# Patient Record
Sex: Female | Born: 1955
Health system: Southern US, Community
[De-identification: ages and names within clinical notes are randomized; demographics above are authoritative.]

## PROBLEM LIST (undated history)

## (undated) DIAGNOSIS — H269 Unspecified cataract: Secondary | ICD-10-CM

## (undated) DIAGNOSIS — I10 Essential (primary) hypertension: Secondary | ICD-10-CM

## (undated) DIAGNOSIS — Z9221 Personal history of antineoplastic chemotherapy: Secondary | ICD-10-CM

## (undated) HISTORY — PX: BREAST SURGERY: SHX581

## (undated) HISTORY — DX: Essential (primary) hypertension: I10

## (undated) HISTORY — DX: Unspecified cataract: H26.9

## (undated) HISTORY — PX: MASTECTOMY: SHX3

---

## 1998-06-03 ENCOUNTER — Encounter: Admission: RE | Admit: 1998-06-03 | Discharge: 1998-06-03 | Payer: Self-pay | Admitting: *Deleted

## 1998-06-30 ENCOUNTER — Encounter: Payer: Self-pay | Admitting: Internal Medicine

## 1998-06-30 ENCOUNTER — Ambulatory Visit (HOSPITAL_COMMUNITY): Admission: RE | Admit: 1998-06-30 | Discharge: 1998-06-30 | Payer: Self-pay | Admitting: Internal Medicine

## 1998-10-12 ENCOUNTER — Other Ambulatory Visit: Admission: RE | Admit: 1998-10-12 | Discharge: 1998-10-12 | Payer: Self-pay | Admitting: Obstetrics and Gynecology

## 1999-10-22 ENCOUNTER — Other Ambulatory Visit: Admission: RE | Admit: 1999-10-22 | Discharge: 1999-10-22 | Payer: Self-pay | Admitting: Obstetrics and Gynecology

## 2000-11-15 ENCOUNTER — Other Ambulatory Visit: Admission: RE | Admit: 2000-11-15 | Discharge: 2000-11-15 | Payer: Self-pay | Admitting: Obstetrics and Gynecology

## 2000-11-21 ENCOUNTER — Encounter: Payer: Self-pay | Admitting: Obstetrics and Gynecology

## 2000-11-21 ENCOUNTER — Encounter: Admission: RE | Admit: 2000-11-21 | Discharge: 2000-11-21 | Payer: Self-pay | Admitting: Obstetrics and Gynecology

## 2000-11-22 ENCOUNTER — Encounter: Payer: Self-pay | Admitting: Obstetrics and Gynecology

## 2000-11-22 ENCOUNTER — Encounter (INDEPENDENT_AMBULATORY_CARE_PROVIDER_SITE_OTHER): Payer: Self-pay | Admitting: Specialist

## 2000-11-22 ENCOUNTER — Other Ambulatory Visit: Admission: RE | Admit: 2000-11-22 | Discharge: 2000-11-22 | Payer: Self-pay | Admitting: Obstetrics and Gynecology

## 2000-11-22 ENCOUNTER — Encounter: Admission: RE | Admit: 2000-11-22 | Discharge: 2000-11-22 | Payer: Self-pay | Admitting: Obstetrics and Gynecology

## 2000-12-04 ENCOUNTER — Encounter: Payer: Self-pay | Admitting: Surgery

## 2000-12-04 ENCOUNTER — Ambulatory Visit (HOSPITAL_BASED_OUTPATIENT_CLINIC_OR_DEPARTMENT_OTHER): Admission: RE | Admit: 2000-12-04 | Discharge: 2000-12-04 | Payer: Self-pay | Admitting: Surgery

## 2000-12-04 ENCOUNTER — Encounter (INDEPENDENT_AMBULATORY_CARE_PROVIDER_SITE_OTHER): Payer: Self-pay | Admitting: *Deleted

## 2000-12-04 ENCOUNTER — Encounter: Admission: RE | Admit: 2000-12-04 | Discharge: 2000-12-04 | Payer: Self-pay | Admitting: Surgery

## 2001-01-10 ENCOUNTER — Encounter: Payer: Self-pay | Admitting: Surgery

## 2001-01-12 ENCOUNTER — Encounter (INDEPENDENT_AMBULATORY_CARE_PROVIDER_SITE_OTHER): Payer: Self-pay | Admitting: *Deleted

## 2001-01-12 ENCOUNTER — Inpatient Hospital Stay (HOSPITAL_COMMUNITY): Admission: RE | Admit: 2001-01-12 | Discharge: 2001-01-16 | Payer: Self-pay | Admitting: Surgery

## 2001-02-13 ENCOUNTER — Encounter: Admission: RE | Admit: 2001-02-13 | Discharge: 2001-05-14 | Payer: Self-pay | Admitting: Plastic Surgery

## 2001-02-22 ENCOUNTER — Encounter: Payer: Self-pay | Admitting: Surgery

## 2001-02-22 ENCOUNTER — Ambulatory Visit (HOSPITAL_COMMUNITY): Admission: RE | Admit: 2001-02-22 | Discharge: 2001-02-22 | Payer: Self-pay | Admitting: Surgery

## 2001-10-04 ENCOUNTER — Other Ambulatory Visit: Admission: RE | Admit: 2001-10-04 | Discharge: 2001-10-04 | Payer: Self-pay | Admitting: *Deleted

## 2001-10-09 ENCOUNTER — Ambulatory Visit (HOSPITAL_BASED_OUTPATIENT_CLINIC_OR_DEPARTMENT_OTHER): Admission: RE | Admit: 2001-10-09 | Discharge: 2001-10-09 | Payer: Self-pay | Admitting: Surgery

## 2001-11-27 ENCOUNTER — Encounter: Admission: RE | Admit: 2001-11-27 | Discharge: 2001-11-27 | Payer: Self-pay | Admitting: Oncology

## 2001-11-27 ENCOUNTER — Encounter: Payer: Self-pay | Admitting: Oncology

## 2002-05-31 ENCOUNTER — Encounter: Payer: Self-pay | Admitting: Emergency Medicine

## 2002-05-31 ENCOUNTER — Emergency Department (HOSPITAL_COMMUNITY): Admission: EM | Admit: 2002-05-31 | Discharge: 2002-05-31 | Payer: Self-pay | Admitting: Emergency Medicine

## 2002-11-28 ENCOUNTER — Encounter: Payer: Self-pay | Admitting: Oncology

## 2002-11-28 ENCOUNTER — Encounter: Admission: RE | Admit: 2002-11-28 | Discharge: 2002-11-28 | Payer: Self-pay | Admitting: Oncology

## 2003-02-03 ENCOUNTER — Other Ambulatory Visit: Admission: RE | Admit: 2003-02-03 | Discharge: 2003-02-03 | Payer: Self-pay

## 2004-01-30 ENCOUNTER — Other Ambulatory Visit: Admission: RE | Admit: 2004-01-30 | Discharge: 2004-01-30 | Payer: Self-pay | Admitting: Family Medicine

## 2004-04-21 ENCOUNTER — Ambulatory Visit (HOSPITAL_COMMUNITY): Admission: RE | Admit: 2004-04-21 | Discharge: 2004-04-21 | Payer: Self-pay | Admitting: Gastroenterology

## 2004-04-21 ENCOUNTER — Encounter (INDEPENDENT_AMBULATORY_CARE_PROVIDER_SITE_OTHER): Payer: Self-pay | Admitting: *Deleted

## 2004-06-23 ENCOUNTER — Encounter: Admission: RE | Admit: 2004-06-23 | Discharge: 2004-06-23 | Payer: Self-pay | Admitting: Oncology

## 2004-08-06 ENCOUNTER — Ambulatory Visit: Payer: Self-pay | Admitting: Oncology

## 2004-08-06 ENCOUNTER — Ambulatory Visit (HOSPITAL_COMMUNITY): Admission: RE | Admit: 2004-08-06 | Discharge: 2004-08-06 | Payer: Self-pay | Admitting: Oncology

## 2004-09-03 ENCOUNTER — Encounter (HOSPITAL_COMMUNITY): Admission: RE | Admit: 2004-09-03 | Discharge: 2004-10-02 | Payer: Self-pay | Admitting: Oncology

## 2004-09-04 ENCOUNTER — Encounter (INDEPENDENT_AMBULATORY_CARE_PROVIDER_SITE_OTHER): Payer: Self-pay | Admitting: Specialist

## 2004-09-04 ENCOUNTER — Ambulatory Visit (HOSPITAL_COMMUNITY): Admission: RE | Admit: 2004-09-04 | Discharge: 2004-09-04 | Payer: Self-pay | Admitting: Obstetrics and Gynecology

## 2004-09-22 ENCOUNTER — Ambulatory Visit: Payer: Self-pay | Admitting: Oncology

## 2004-12-08 ENCOUNTER — Ambulatory Visit: Payer: Self-pay | Admitting: Oncology

## 2005-03-02 ENCOUNTER — Ambulatory Visit: Payer: Self-pay | Admitting: Oncology

## 2005-06-24 ENCOUNTER — Encounter: Admission: RE | Admit: 2005-06-24 | Discharge: 2005-06-24 | Payer: Self-pay | Admitting: Oncology

## 2005-08-31 ENCOUNTER — Ambulatory Visit: Payer: Self-pay | Admitting: Oncology

## 2005-12-06 ENCOUNTER — Ambulatory Visit: Payer: Self-pay | Admitting: Oncology

## 2006-03-03 ENCOUNTER — Ambulatory Visit: Payer: Self-pay | Admitting: Oncology

## 2006-03-03 LAB — COMPREHENSIVE METABOLIC PANEL
Albumin: 4.4 g/dL (ref 3.5–5.2)
Alkaline Phosphatase: 52 U/L (ref 39–117)
BUN: 15 mg/dL (ref 6–23)
CO2: 29 mEq/L (ref 19–32)
Calcium: 9.8 mg/dL (ref 8.4–10.5)
Glucose, Bld: 82 mg/dL (ref 70–99)
Potassium: 4.1 mEq/L (ref 3.5–5.3)
Total Protein: 7.2 g/dL (ref 6.0–8.3)

## 2006-03-03 LAB — CBC WITH DIFFERENTIAL/PLATELET
Basophils Absolute: 0.1 10*3/uL (ref 0.0–0.1)
Eosinophils Absolute: 0.1 10*3/uL (ref 0.0–0.5)
HGB: 12.5 g/dL (ref 11.6–15.9)
MCV: 95.8 fL (ref 81.0–101.0)
MONO#: 0.5 10*3/uL (ref 0.1–0.9)
MONO%: 8.6 % (ref 0.0–13.0)
NEUT#: 3.2 10*3/uL (ref 1.5–6.5)
Platelets: 233 10*3/uL (ref 145–400)
RDW: 14.1 % (ref 11.3–14.5)
WBC: 5.9 10*3/uL (ref 3.9–10.0)

## 2006-03-03 LAB — CANCER ANTIGEN 27.29: CA 27.29: 14 U/mL (ref 0–39)

## 2006-07-13 ENCOUNTER — Encounter: Admission: RE | Admit: 2006-07-13 | Discharge: 2006-07-13 | Payer: Self-pay | Admitting: Oncology

## 2006-09-05 ENCOUNTER — Ambulatory Visit: Payer: Self-pay | Admitting: Oncology

## 2006-09-08 LAB — CBC WITH DIFFERENTIAL/PLATELET
Basophils Absolute: 0.1 10*3/uL (ref 0.0–0.1)
Eosinophils Absolute: 0.2 10*3/uL (ref 0.0–0.5)
HCT: 38.9 % (ref 34.8–46.6)
HGB: 13.3 g/dL (ref 11.6–15.9)
LYMPH%: 36.9 % (ref 14.0–48.0)
MCV: 95.8 fL (ref 81.0–101.0)
MONO%: 7.3 % (ref 0.0–13.0)
NEUT#: 2.9 10*3/uL (ref 1.5–6.5)
Platelets: 260 10*3/uL (ref 145–400)

## 2006-09-08 LAB — COMPREHENSIVE METABOLIC PANEL
Albumin: 4.6 g/dL (ref 3.5–5.2)
Alkaline Phosphatase: 69 U/L (ref 39–117)
BUN: 16 mg/dL (ref 6–23)
Glucose, Bld: 95 mg/dL (ref 70–99)
Total Bilirubin: 0.3 mg/dL (ref 0.3–1.2)

## 2007-03-07 ENCOUNTER — Ambulatory Visit: Payer: Self-pay | Admitting: Oncology

## 2007-03-08 LAB — CBC WITH DIFFERENTIAL/PLATELET
Basophils Absolute: 0 10*3/uL (ref 0.0–0.1)
HCT: 35 % (ref 34.8–46.6)
HGB: 12.1 g/dL (ref 11.6–15.9)
MONO#: 0.5 10*3/uL (ref 0.1–0.9)
NEUT%: 47.3 % (ref 39.6–76.8)
Platelets: 264 10*3/uL (ref 145–400)
WBC: 6.3 10*3/uL (ref 3.9–10.0)
lymph#: 2.6 10*3/uL (ref 0.9–3.3)

## 2007-03-08 LAB — FSH/LH
FSH: 35.1 m[IU]/mL
LH: 21.3 m[IU]/mL

## 2007-03-08 LAB — COMPREHENSIVE METABOLIC PANEL
BUN: 13 mg/dL (ref 6–23)
CO2: 25 mEq/L (ref 19–32)
Calcium: 9.5 mg/dL (ref 8.4–10.5)
Chloride: 105 mEq/L (ref 96–112)
Creatinine, Ser: 0.79 mg/dL (ref 0.40–1.20)
Glucose, Bld: 89 mg/dL (ref 70–99)

## 2007-03-16 LAB — ESTRADIOL, ULTRA SENS: Estradiol, Ultra Sensitive: <2 pg/mL

## 2007-07-26 ENCOUNTER — Encounter: Admission: RE | Admit: 2007-07-26 | Discharge: 2007-07-26 | Payer: Self-pay | Admitting: Oncology

## 2007-09-05 ENCOUNTER — Ambulatory Visit: Payer: Self-pay | Admitting: Oncology

## 2007-09-07 LAB — CBC WITH DIFFERENTIAL/PLATELET
EOS%: 1.7 % (ref 0.0–7.0)
Eosinophils Absolute: 0.1 10*3/uL (ref 0.0–0.5)
LYMPH%: 35.9 % (ref 14.0–48.0)
MCH: 33 pg (ref 26.0–34.0)
MCHC: 35.2 g/dL (ref 32.0–36.0)
MCV: 93.9 fL (ref 81.0–101.0)
MONO%: 4.8 % (ref 0.0–13.0)
NEUT#: 3.7 10*3/uL (ref 1.5–6.5)
Platelets: 262 10*3/uL (ref 145–400)
RBC: 4.01 10*6/uL (ref 3.70–5.32)
RDW: 14.1 % (ref 11.3–14.5)

## 2007-09-07 LAB — COMPREHENSIVE METABOLIC PANEL
AST: 17 U/L (ref 0–37)
Albumin: 4.6 g/dL (ref 3.5–5.2)
Alkaline Phosphatase: 72 U/L (ref 39–117)
Glucose, Bld: 89 mg/dL (ref 70–99)
Potassium: 4 mEq/L (ref 3.5–5.3)
Sodium: 139 mEq/L (ref 135–145)
Total Bilirubin: 0.3 mg/dL (ref 0.3–1.2)
Total Protein: 7.8 g/dL (ref 6.0–8.3)

## 2008-03-07 ENCOUNTER — Ambulatory Visit: Payer: Self-pay | Admitting: Oncology

## 2008-03-07 LAB — COMPREHENSIVE METABOLIC PANEL
Alkaline Phosphatase: 73 U/L (ref 39–117)
BUN: 13 mg/dL (ref 6–23)
Glucose, Bld: 94 mg/dL (ref 70–99)
Sodium: 139 mEq/L (ref 135–145)
Total Bilirubin: 0.2 mg/dL — ABNORMAL LOW (ref 0.3–1.2)
Total Protein: 7.6 g/dL (ref 6.0–8.3)

## 2008-03-07 LAB — CBC WITH DIFFERENTIAL/PLATELET
BASO%: 0.4 % (ref 0.0–2.0)
Basophils Absolute: 0 10*3/uL (ref 0.0–0.1)
EOS%: 2.1 % (ref 0.0–7.0)
HCT: 36.4 % (ref 34.8–46.6)
MCH: 32 pg (ref 26.0–34.0)
MCHC: 34.3 g/dL (ref 32.0–36.0)
MCV: 93 fL (ref 81.0–101.0)
MONO%: 8.2 % (ref 0.0–13.0)
NEUT%: 48.9 % (ref 39.6–76.8)
lymph#: 2.6 10*3/uL (ref 0.9–3.3)

## 2008-07-28 ENCOUNTER — Encounter: Admission: RE | Admit: 2008-07-28 | Discharge: 2008-07-28 | Payer: Self-pay | Admitting: Oncology

## 2008-08-20 ENCOUNTER — Ambulatory Visit: Payer: Self-pay | Admitting: Oncology

## 2008-08-20 LAB — CBC WITH DIFFERENTIAL/PLATELET
BASO%: 0.7 % (ref 0.0–2.0)
EOS%: 1.7 % (ref 0.0–7.0)
MCH: 33.1 pg (ref 26.0–34.0)
MCHC: 34.3 g/dL (ref 32.0–36.0)
MONO%: 8.5 % (ref 0.0–13.0)
RDW: 14.7 % — ABNORMAL HIGH (ref 11.3–14.5)
lymph#: 2.3 10*3/uL (ref 0.9–3.3)

## 2008-08-20 LAB — COMPREHENSIVE METABOLIC PANEL
ALT: 13 U/L (ref 0–35)
AST: 10 U/L (ref 0–37)
Albumin: 4.2 g/dL (ref 3.5–5.2)
Alkaline Phosphatase: 59 U/L (ref 39–117)
Calcium: 9.2 mg/dL (ref 8.4–10.5)
Chloride: 104 mEq/L (ref 96–112)
Creatinine, Ser: 0.8 mg/dL (ref 0.40–1.20)
Potassium: 4.4 mEq/L (ref 3.5–5.3)

## 2008-08-26 ENCOUNTER — Encounter: Admission: RE | Admit: 2008-08-26 | Discharge: 2008-08-26 | Payer: Self-pay | Admitting: Oncology

## 2009-04-17 ENCOUNTER — Ambulatory Visit: Payer: Self-pay | Admitting: Oncology

## 2009-04-21 LAB — CBC WITH DIFFERENTIAL/PLATELET
BASO%: 0.6 % (ref 0.0–2.0)
Basophils Absolute: 0 10*3/uL (ref 0.0–0.1)
EOS%: 1.4 % (ref 0.0–7.0)
HGB: 12.3 g/dL (ref 11.6–15.9)
MCH: 32.9 pg (ref 25.1–34.0)
RDW: 14.5 % (ref 11.2–14.5)
lymph#: 2.2 10*3/uL (ref 0.9–3.3)

## 2009-04-21 LAB — COMPREHENSIVE METABOLIC PANEL
ALT: 15 U/L (ref 0–35)
AST: 15 U/L (ref 0–37)
Albumin: 4.3 g/dL (ref 3.5–5.2)
BUN: 16 mg/dL (ref 6–23)
Calcium: 9.8 mg/dL (ref 8.4–10.5)
Chloride: 100 mEq/L (ref 96–112)
Potassium: 4 mEq/L (ref 3.5–5.3)
Sodium: 132 mEq/L — ABNORMAL LOW (ref 135–145)
Total Protein: 7.3 g/dL (ref 6.0–8.3)

## 2009-06-16 ENCOUNTER — Encounter: Admission: RE | Admit: 2009-06-16 | Discharge: 2009-06-16 | Payer: Self-pay | Admitting: Obstetrics and Gynecology

## 2009-07-14 ENCOUNTER — Ambulatory Visit: Payer: Self-pay | Admitting: Oncology

## 2009-09-24 ENCOUNTER — Ambulatory Visit: Payer: Self-pay | Admitting: Oncology

## 2009-09-29 LAB — CBC WITH DIFFERENTIAL/PLATELET
BASO%: 0.3 % (ref 0.0–2.0)
EOS%: 1.6 % (ref 0.0–7.0)
HGB: 13.1 g/dL (ref 11.6–15.9)
LYMPH%: 40.7 % (ref 14.0–49.7)
MONO#: 0.4 10*3/uL (ref 0.1–0.9)
NEUT#: 2.9 10*3/uL (ref 1.5–6.5)
NEUT%: 49.9 % (ref 38.4–76.8)
RBC: 4.11 10*6/uL (ref 3.70–5.45)
RDW: 14.1 % (ref 11.2–14.5)
WBC: 5.8 10*3/uL (ref 3.9–10.3)
lymph#: 2.3 10*3/uL (ref 0.9–3.3)

## 2009-09-29 LAB — COMPREHENSIVE METABOLIC PANEL
AST: 16 U/L (ref 0–37)
Alkaline Phosphatase: 65 U/L (ref 39–117)
BUN: 11 mg/dL (ref 6–23)
Calcium: 9.6 mg/dL (ref 8.4–10.5)
Chloride: 103 mEq/L (ref 96–112)
Creatinine, Ser: 0.81 mg/dL (ref 0.40–1.20)

## 2009-10-07 LAB — ESTRADIOL, ULTRA SENS: Estradiol, Ultra Sensitive: 18 pg/mL

## 2010-06-21 ENCOUNTER — Encounter: Admission: RE | Admit: 2010-06-21 | Discharge: 2010-06-21 | Payer: Self-pay | Admitting: Oncology

## 2010-06-25 ENCOUNTER — Ambulatory Visit: Payer: Self-pay | Admitting: Oncology

## 2010-06-29 ENCOUNTER — Ambulatory Visit (HOSPITAL_COMMUNITY): Admission: RE | Admit: 2010-06-29 | Discharge: 2010-06-29 | Payer: Self-pay | Admitting: Specialist

## 2010-06-29 LAB — CBC WITH DIFFERENTIAL/PLATELET
Basophils Absolute: 0 10*3/uL (ref 0.0–0.1)
Eosinophils Absolute: 0.1 10*3/uL (ref 0.0–0.5)
HGB: 12.3 g/dL (ref 11.6–15.9)
MCV: 95.1 fL (ref 79.5–101.0)
MONO#: 0.4 10*3/uL (ref 0.1–0.9)
MONO%: 7.7 % (ref 0.0–14.0)
NEUT#: 2.4 10*3/uL (ref 1.5–6.5)
Platelets: 230 10*3/uL (ref 145–400)
RDW: 14.5 % (ref 11.2–14.5)
WBC: 5.1 10*3/uL (ref 3.9–10.3)

## 2010-06-29 LAB — COMPREHENSIVE METABOLIC PANEL
Albumin: 4.3 g/dL (ref 3.5–5.2)
Alkaline Phosphatase: 64 U/L (ref 39–117)
BUN: 16 mg/dL (ref 6–23)
CO2: 24 mEq/L (ref 19–32)
Calcium: 9.5 mg/dL (ref 8.4–10.5)
Chloride: 104 mEq/L (ref 96–112)
Glucose, Bld: 108 mg/dL — ABNORMAL HIGH (ref 70–99)
Potassium: 4 mEq/L (ref 3.5–5.3)
Sodium: 138 mEq/L (ref 135–145)
Total Protein: 7.1 g/dL (ref 6.0–8.3)

## 2010-09-06 ENCOUNTER — Ambulatory Visit (HOSPITAL_COMMUNITY): Admission: RE | Admit: 2010-09-06 | Discharge: 2010-09-06 | Payer: Self-pay | Admitting: Oncology

## 2010-09-21 ENCOUNTER — Ambulatory Visit (HOSPITAL_COMMUNITY): Admission: RE | Admit: 2010-09-21 | Payer: Self-pay | Source: Home / Self Care | Admitting: Oncology

## 2010-10-23 ENCOUNTER — Other Ambulatory Visit: Payer: Self-pay | Admitting: Oncology

## 2010-10-23 DIAGNOSIS — Z0000 Encounter for general adult medical examination without abnormal findings: Secondary | ICD-10-CM

## 2010-10-24 ENCOUNTER — Encounter: Payer: Self-pay | Admitting: Oncology

## 2011-02-18 NOTE — Op Note (Signed)
NAME:  Diane Franco, Diane Franco              ACCOUNT NO.:  [id number]   MEDICAL RECORD NO.:  [id number]          PATIENT TYPE:  AMB   LOCATION:  SDC                           FACILITY:  [facility]   PHYSICIAN:  Guy Sandifer. Tomblin II, M.D.DATE OF BIRTH:  1956-07-24   DATE OF PROCEDURE:  09/04/2004  DATE OF DISCHARGE:                                 OPERATIVE REPORT   PREOPERATIVE DIAGNOSIS:  Abnormal uterine bleeding.   POSTOPERATIVE DIAGNOSIS:  Submucous fibroid   PROCEDURE:  1.  Hysteroscopy with resection of endometrial mass.  2.  Dilatation and curettage.  3.  Lidocaine 1% paracervical block.   SURGEON:  Guy Sandifer. Henderson Cloud, M.D.   ANESTHESIA:  General with LMA, Donald T. Pamalee Leyden, M.D.   ESTIMATED BLOOD LOSS:  Less than 50 cc.   I'S AND O'S:  Distending medium 140 cc deficit.   SPECIMENS:  1.  Endometrial mass.  2.  Endometrial curettings.   INDICATIONS AND CONSENT:  The patient is a 55 year old divorced black  female, G2, P2, status post left breast carcinoma with mastectomy,  subsequent chemotherapy.  She had been amenorrheic for three years on  Tamoxifen and then, Femara.  She then had an episode of heavy vaginal  bleeding times two.  Her hemoglobin dropped two grams and Dr. Darrold Span  transfused iron intravenously the day prior to surgery.  The CT scan on  November 4 of this year noted a prominence of central aspect to the uterus.  Hysteroscopy with resectoscope and D&C has been discussed with the patient  preoperatively.  The potential risks and complications were reviewed and  included, but are not limited to, infection, uterine perforation, bowel or  bladder ureteral damage, bleeding requiring transfusion of blood products,  possible transfusion reaction, HIV, and hepatitis acquisition, DVT, PE,  pneumonia, hysterectomy, laparotomy, laparoscopy.  All questions were  answered. Consent is signed and on the chart.   FINDINGS:  There is a 3 to 4 cm smooth, firm mass arising from the  superior-  anterior aspect of the cavity, most consistent with a submucous fibroid. The  remainder of the cavity appears normal.   PROCEDURE:  The patient was taken to the operating room, where she was  identified, placed in the dorsal supine position and general anesthesia was  induced via LMA.  She was then placed in the dorsal lithotomy position,  where she was prepped, bladder straight catheterized, and she was draped in  a sterile fashion.  The anterior cervical lip was injected with 1% Xylocaine  and grasped with a single tooth tenaculum. The paracervical block was placed  at the 2, 4, 5, 7, 8, and 10 o'clock positions with approximately 20 cc  total, 1% Xylocaine.  The cervix is gently progressing dilated to a 29 Pratt  dilator. The diagnostic hysteroscope was placed in the endocervical canal  and advanced under visualization, using distending media.  The above  findings were noted.  The cervix is then dilated to a 33 Pratt dilator and  the resectoscope with a single right-angle wire loop was advanced under  direct visualization.  The mass is then  resected in multiple strips with  good visualization.  Good hemostasis is maintained.  At the end of the  procedure, the mass had been resected down to the level of the surrounding  endometrial canal.  The resectoscope is then withdrawn and sharp curettage  is carried out for a small amount of tissue.  Good hemostasis is noted. All  instruments are removed.  The patient is awakened and taken to the recovery  room in stable condition.      JET/MEDQ  D:  09/04/2004  T:  09/05/2004  Job:  [id number]

## 2011-02-18 NOTE — Discharge Summary (Signed)
[place]. [facility]  Patient:    Diane Franco, Diane Franco                       MRN: [id number] Adm. Date:  [id number] Disc. Date: 01/16/01 Attending:  Bonnetta Barry                           Discharge Summary  FINAL DIAGNOSIS:  Left breast cancer.  PROCEDURES PERFORMED: 1. Left total mastectomy and axillary sentinel lymph node biopsy. 2. Left breast reconstruction with a supercharged ipsilateral transverse    rectus abdominis myocutaneous flap.  SUMMARY OF H&P:  A 55 year old woman with left breast biopsy for fibrocystic disease in 1993, returns with a new breast mass left side on self exam. 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  She had an extensive discussion regarding options and the recommendation was for mastectomy and she was very interested in reconstruction.  A TRAM flap reconstruction was planned. She was quite large-breasted and would like to remain so as much as possible so supercharging was also discussed with her.  For further details of history and physical, please see the chart.  HOSPITAL COURSE:  On admission, her hemoglobin was 11.1, hematocrit 33.3. Other labs were within normal limits with the exception of calcium at 8.2. The day of admission she was taken to surgery at which time the mastectomy with sentinel lymph node biopsy and supercharged TRAM flap reconstruction was performed.  She tolerated these procedures well.  Postoperatively, she did well.  Flap had excellent color as did the abdominal closure.  Drains functioning.  Good urine output.  Low grade fever responded to pulmonary hygiene and ambulation.  By the second postoperative day she was tolerating her diet and by the third day the IV was removed and dextran, which had been used for the microsurgical anastomosis in the axilla, was discontinued.  Hemoglobin had drifted down to the 7.9 range and she was kept one extra day  to make sure that it stabilized and indeed it did at 7.9.  She is totally asymptomatic and is ambulating without difficulty.  It is felt that she is ready to be discharged.  DISPOSITION:  She is discharged on a regular diet.  She is instructed to avoid lifting, vigorous activity, or raising her left arm.  No showering yet.  Empty drain three times a day and record the amount. Use the spirometer at least times a day.  DISCHARGE MEDICATIONS: 1. She is discharged on her iron as she was taking prior to surgery. 2. Percocet 40 given one to two p.o. q.6h. p.r.n. for pain. 3. Keflex 250 mg p.o. q.i.d. for the next couple of days.  FOLLOW-UP:  She will follow up Velora Heckler, M.D., in two weeks and follow up with Teena Irani. Odis Luster, M.D., next week for drain removal and check of the wounds. DD:  01/16/01 TD:  01/16/01 Job: [id number]

## 2011-02-18 NOTE — H&P (Signed)
NAME:  Diane Franco, Diane Franco              ACCOUNT NO.:  [id number]   MEDICAL RECORD NO.:  [id number]          PATIENT TYPE:  AMB   LOCATION:  SDC                           FACILITY:  [facility]   PHYSICIAN:  Guy Sandifer. Tomblin II, M.D.DATE OF BIRTH:  10-12-1955   DATE OF ADMISSION:  09/04/2004  DATE OF DISCHARGE:                                HISTORY & PHYSICAL   CHIEF COMPLAINT:  Heavy bleeding.   HISTORY OF PRESENT ILLNESS:  This patient is a 55 year old divorced black  female, G2, P2, who had a T1 N1, ER/PR positive, HER/2-neu negative left  breast carcinoma in February of 2002. She subsequently received chemotherapy  which stopped her menses approximately 3 years ago. She was on tamoxifen  until recently when she started Femara. She had a heavy blood flow on  November 3. There was no menstrual cramping with this. She then had a 2nd  heavier bleeding episode earlier this week. A CAT scan of the abdomen on  August 06, 2004, had a question of tiny nodules in the left upper lung  zone. A CT scan of the pelvis revealed a prominent central aspect to the  uterus. The patient presented for sonohysterogram on August 30, 2004, but  it could not be done secondary to heavy menstrual flow at the time. Her  bleeding has now subsided somewhat, although her hemoglobin was noted to  drop from approximately 11.5 to approximately 9.5 g. The patient received IV  iron on September 03, 2004, per Dr. Darrold Span. She is being admitted for  hysteroscopy with resectoscope, dilatation and curettage. Potential risks  and complications have been discussed with the patient preoperatively.   PAST MEDICAL HISTORY:  1.  Breast carcinoma as above.  2.  Abnormal uterine bleeding as above.   PAST SURGICAL HISTORY:  Left breast mastectomy with reconstruction.   OBSTETRIC HISTORY:  Vaginal delivery x2.   MEDICATIONS:  The patient was on tamoxifen. Had recently been started on  Femara which has recently been discontinued by Dr.  Darrold Span. Otherwise takes  iron and vitamins.   ALLERGIES:  No known drug allergies.   SOCIAL HISTORY:  Denies tobacco, alcohol or drug abuse.   FAMILY HISTORY:  Sister, brother and father with diabetes. Father with  prostate cancer. Chronic hypertension in father, sister and 3 brothers.   REVIEW OF SYSTEMS:  NEUROLOGIC: Denies headache. CARDIAC: Denies chest pain.  PULMONARY: Denies shortness of breath.   PHYSICAL EXAMINATION:  VITAL SIGNS:  Height 5 feet 1 inch, weight 174  pounds.  HEENT:  Without thyromegaly.  LUNGS:  Clear to auscultation.  HEART:  Regular rate and rhythm.  BACK:  Without CVA tenderness.  BREASTS:  Not examined.  ABDOMEN:  Soft, nontender without masses.  PELVIC EXAM:  Vulva, vagina and cervix without lesion. Uterus is normal  size, mobile, nontender. Adnexa nontender without masses.  EXTREMITIES AND NEUROLOGICAL EXAM:  Grossly within normal limits.   ASSESSMENT:  Abnormal uterine bleeding, status post breast cancer, status  post tamoxifen and Femara treatment.   PLAN:  Hysteroscopy with resectoscope, D&C.      JET/MEDQ  D:  09/03/2004  T:  09/03/2004  Job:  [id number]

## 2011-06-23 ENCOUNTER — Ambulatory Visit
Admission: RE | Admit: 2011-06-23 | Discharge: 2011-06-23 | Disposition: A | Discharge status: Home / Self Care | Payer: BC Managed Care – PPO | Source: Ambulatory Visit | Attending: Oncology | Admitting: Oncology

## 2011-06-23 DIAGNOSIS — Z0000 Encounter for general adult medical examination without abnormal findings: Secondary | ICD-10-CM

## 2011-07-25 ENCOUNTER — Other Ambulatory Visit: Payer: Self-pay | Admitting: Oncology

## 2011-07-25 ENCOUNTER — Encounter (HOSPITAL_BASED_OUTPATIENT_CLINIC_OR_DEPARTMENT_OTHER): Payer: BC Managed Care – PPO | Admitting: Oncology

## 2011-07-25 DIAGNOSIS — I10 Essential (primary) hypertension: Secondary | ICD-10-CM

## 2011-07-25 DIAGNOSIS — C50419 Malignant neoplasm of upper-outer quadrant of unspecified female breast: Secondary | ICD-10-CM

## 2011-07-25 LAB — CBC WITH DIFFERENTIAL/PLATELET
Basophils Absolute: 0 10*3/uL (ref 0.0–0.1)
EOS%: 1.8 % (ref 0.0–7.0)
Eosinophils Absolute: 0.1 10*3/uL (ref 0.0–0.5)
HGB: 12.6 g/dL (ref 11.6–15.9)
LYMPH%: 35.6 % (ref 14.0–49.7)
MCH: 32.6 pg (ref 25.1–34.0)
MCV: 95.6 fL (ref 79.5–101.0)
MONO%: 9.7 % (ref 0.0–14.0)
NEUT#: 3.2 10*3/uL (ref 1.5–6.5)
Platelets: 237 10*3/uL (ref 145–400)

## 2011-07-25 LAB — COMPREHENSIVE METABOLIC PANEL
AST: 15 U/L (ref 0–37)
Alkaline Phosphatase: 63 U/L (ref 39–117)
BUN: 15 mg/dL (ref 6–23)
Creatinine, Ser: 0.93 mg/dL (ref 0.50–1.10)
Glucose, Bld: 87 mg/dL (ref 70–99)
Total Bilirubin: 0.2 mg/dL — ABNORMAL LOW (ref 0.3–1.2)

## 2011-07-29 ENCOUNTER — Encounter (HOSPITAL_BASED_OUTPATIENT_CLINIC_OR_DEPARTMENT_OTHER): Payer: BC Managed Care – PPO | Admitting: Oncology

## 2011-07-29 DIAGNOSIS — C50419 Malignant neoplasm of upper-outer quadrant of unspecified female breast: Secondary | ICD-10-CM

## 2011-07-29 DIAGNOSIS — I10 Essential (primary) hypertension: Secondary | ICD-10-CM

## 2011-07-29 DIAGNOSIS — M542 Cervicalgia: Secondary | ICD-10-CM

## 2011-08-09 ENCOUNTER — Telehealth: Payer: Self-pay | Admitting: Oncology

## 2011-08-09 ENCOUNTER — Other Ambulatory Visit: Payer: Self-pay | Admitting: Oncology

## 2011-08-09 DIAGNOSIS — Z9012 Acquired absence of left breast and nipple: Secondary | ICD-10-CM

## 2011-08-09 DIAGNOSIS — Z9889 Other specified postprocedural states: Secondary | ICD-10-CM

## 2011-08-09 DIAGNOSIS — Z1231 Encounter for screening mammogram for malignant neoplasm of breast: Secondary | ICD-10-CM

## 2011-08-09 DIAGNOSIS — Z853 Personal history of malignant neoplasm of breast: Secondary | ICD-10-CM

## 2011-08-09 NOTE — Telephone Encounter (Signed)
Called pt,left message for mammogram 06/25/12 9am

## 2012-06-11 ENCOUNTER — Ambulatory Visit (INDEPENDENT_AMBULATORY_CARE_PROVIDER_SITE_OTHER): Payer: BC Managed Care – PPO | Admitting: Emergency Medicine

## 2012-06-11 VITALS — BP 170/80 | HR 77 | Temp 97.9°F | Resp 20 | Ht 62.5 in | Wt 185.0 lb

## 2012-06-11 DIAGNOSIS — B029 Zoster without complications: Secondary | ICD-10-CM

## 2012-06-11 DIAGNOSIS — J029 Acute pharyngitis, unspecified: Secondary | ICD-10-CM

## 2012-06-11 MED ORDER — VALACYCLOVIR HCL 1 G PO TABS: 1000.0000 mg | ORAL_TABLET | Freq: Two times a day (BID) | ORAL | Status: DC

## 2012-06-11 MED ORDER — PENICILLIN V POTASSIUM 500 MG PO TABS: 500.0000 mg | ORAL_TABLET | Freq: Four times a day (QID) | ORAL | Status: AC

## 2012-06-11 NOTE — Progress Notes (Signed)
  Date:  06/11/2012   Name:  Diane Franco   DOB:  1955-11-18   MRN:  [id number] Gender: female Age: 56 y.o.  PCP:  No primary provider on file.    Chief Complaint: Sore Throat and Rash   History of Present Illness:  Diane Franco is a 56 y.o. pleasant patient who presents with the following:  Two issues.  While at [facility] last weekend developed a linear coalescent vesicular eruption on medial lower leg above ankle.  Says initially was painful now just itches.  Worse over course of the week.  No fever or chills, nausea or vomiting.  No lymphangitis.  Has sore throat past few days.  Not associated with nasal congestion or drainage, no cough, nausea or vomiting, no stool change, no wheezing or shortness of breath.  Denies other complaints. There is no problem list on file for this patient.   No past medical history on file.  No past surgical history on file.  History  Substance Use Topics  . Smoking status: Never Smoker   . Smokeless tobacco: Not on file  . Alcohol Use: Not on file    No family history on file.  Allergies  Allergen Reactions  . Codeine Other (See Comments)    dizziness    Medication list has been reviewed and updated.  No current outpatient prescriptions on file prior to visit.    Review of Systems:  As per HPI, otherwise negative.    Physical Examination: Filed Vitals:   06/11/12 1651  BP: 170/80  Pulse: 77  Temp: 97.9 F (36.6 C)  Resp: 20   Filed Vitals:   06/11/12 1651  Height: 5' 2.5" (1.588 m)  Weight: 185 lb (83.915 kg)   Body mass index is 33.30 kg/(m^2). Ideal Body Weight: Weight in (lb) to have BMI = 25: 138.6    GEN: WDWN, NAD, Non-toxic, A & O x 3 HEENT: Atraumatic, Normocephalic. Neck supple. No masses, No LAD.  Oropharynx erythematous Ears and Nose: No external deformity.TM negative.  Nasal mucosa normal CV: RRR, No M/G/R. No JVD. No thrill. No extra heart sounds. PULM: CTA B, no wheezes,  crackles, rhonchi. No retractions. No resp. distress. No accessory muscle use. ABD: S, NT, ND, +BS. No rebound. No HSM. EXTR: No c/c/e NEURO Normal gait.  PSYCH: Normally interactive. Conversant. Not depressed or anxious appearing.  Calm demeanor.  SKIN:  Erythematous vesicular eruption on left lower leg.  Coalescent in chain of lesions.   Assessment and Plan: Shingles valtrex  Carmelina Dane, MD

## 2012-06-16 ENCOUNTER — Telehealth: Payer: Self-pay

## 2012-06-16 NOTE — Telephone Encounter (Signed)
PT WAS SEEN ON 06/11/12 FOR SHINGLES AND HAS BEEN OUT OF WORK. SHE IS GOING BACK TO WORK ON Monday, 06/18/12, BUT IS NEED OF A NOTE STATING SHE WAS OUT FOR A MEDICAL CONDITION AND ITS OK FOR HER TO RETURN. PLEASE CALL PT TO ADVISE OF WE CAN DO THIS FOR HER.

## 2012-06-16 NOTE — Telephone Encounter (Signed)
Can we do this ?

## 2012-06-17 NOTE — Telephone Encounter (Signed)
Please write a note

## 2012-06-17 NOTE — Telephone Encounter (Signed)
Called pt to let her know note ready to pick up.

## 2012-06-25 ENCOUNTER — Ambulatory Visit: Payer: BC Managed Care – PPO

## 2012-07-20 ENCOUNTER — Telehealth: Payer: Self-pay | Admitting: Oncology

## 2012-07-20 NOTE — Telephone Encounter (Signed)
Called pt and left message regarding appt on 07/27/12 moved from MD to ML per MD's call day

## 2012-07-23 ENCOUNTER — Other Ambulatory Visit: Payer: BC Managed Care – PPO | Admitting: Lab

## 2012-07-23 ENCOUNTER — Telehealth: Payer: Self-pay | Admitting: Oncology

## 2012-07-23 NOTE — Telephone Encounter (Signed)
PT CALLED TO MOVE APPT INTO DECEMBER AND REQ THAT i CALL HER BACK AND LM ON HER H# WITH APPT INFO #[phone],       AOM

## 2012-07-27 ENCOUNTER — Ambulatory Visit: Payer: BC Managed Care – PPO | Admitting: Physician Assistant

## 2012-07-27 ENCOUNTER — Ambulatory Visit: Payer: BC Managed Care – PPO | Admitting: Oncology

## 2012-07-27 ENCOUNTER — Other Ambulatory Visit: Payer: BC Managed Care – PPO | Admitting: Lab

## 2012-09-03 ENCOUNTER — Ambulatory Visit (HOSPITAL_BASED_OUTPATIENT_CLINIC_OR_DEPARTMENT_OTHER): Payer: BC Managed Care – PPO | Admitting: Physician Assistant

## 2012-09-03 ENCOUNTER — Telehealth: Payer: Self-pay | Admitting: Oncology

## 2012-09-03 ENCOUNTER — Other Ambulatory Visit (HOSPITAL_BASED_OUTPATIENT_CLINIC_OR_DEPARTMENT_OTHER): Payer: BC Managed Care – PPO | Admitting: Lab

## 2012-09-03 ENCOUNTER — Encounter: Payer: Self-pay | Admitting: Physician Assistant

## 2012-09-03 VITALS — BP 169/76 | HR 78 | Temp 98.1°F | Resp 20 | Ht 62.5 in | Wt 186.9 lb

## 2012-09-03 DIAGNOSIS — C50919 Malignant neoplasm of unspecified site of unspecified female breast: Secondary | ICD-10-CM

## 2012-09-03 DIAGNOSIS — R030 Elevated blood-pressure reading, without diagnosis of hypertension: Secondary | ICD-10-CM

## 2012-09-03 DIAGNOSIS — Z1231 Encounter for screening mammogram for malignant neoplasm of breast: Secondary | ICD-10-CM

## 2012-09-03 DIAGNOSIS — Z853 Personal history of malignant neoplasm of breast: Secondary | ICD-10-CM

## 2012-09-03 LAB — CBC WITH DIFFERENTIAL/PLATELET
BASO%: 0.8 % (ref 0.0–2.0)
EOS%: 1.4 % (ref 0.0–7.0)
LYMPH%: 41.1 % (ref 14.0–49.7)
MCHC: 33.6 g/dL (ref 31.5–36.0)
MONO#: 0.6 10*3/uL (ref 0.1–0.9)
Platelets: 249 10*3/uL (ref 145–400)
RBC: 3.91 10*6/uL (ref 3.70–5.45)
WBC: 7.9 10*3/uL (ref 3.9–10.3)

## 2012-09-03 LAB — COMPREHENSIVE METABOLIC PANEL (CC13)
ALT: 19 U/L (ref 0–55)
AST: 15 U/L (ref 5–34)
Alkaline Phosphatase: 70 U/L (ref 40–150)
CO2: 24 mEq/L (ref 22–29)
Sodium: 138 mEq/L (ref 136–145)
Total Bilirubin: 0.22 mg/dL (ref 0.20–1.20)
Total Protein: 7.4 g/dL (ref 6.4–8.3)

## 2012-09-03 NOTE — Patient Instructions (Addendum)
Be sure to get your screening mammogram! Follow up with Dr. Darrold Span in 1 year See your primary care physician regarding your blood pressure

## 2012-09-03 NOTE — Telephone Encounter (Signed)
gv pt appt schedule for December 2014 and mammo for 09/27/12 @ [facility].

## 2012-09-04 ENCOUNTER — Telehealth: Payer: Self-pay | Admitting: Internal Medicine

## 2012-09-04 NOTE — Telephone Encounter (Signed)
called pt to let her know that per [facility] [place] the pt has to come to the walk in clinic and be estab at that time.  also left her the ph# and address     Diane Franco

## 2012-09-10 NOTE — Progress Notes (Signed)
No images are attached to the encounter. No scans are attached to the encounter. No scans are attached to the encounter. [facility] OFFICE PROGRESS NOTE  [name] M.D. Urgent care at [facility] No primary provider on file.  DIAGNOSIS: History of left breast cancer that was T1 N1 (micrometastatic disease in 1 of 6 nodes) tumor was ER/PR positive and HER-2 negative  PRIOR THERAPY:  1. Status post left mastectomy and axillary node evaluation February of 2002 when she was premenopausal. 2. Status post adjuvant AC/Taxol 3. Status post 5 yeas of tamoxifen through December 2007  CURRENT THERAPY: Observation  INTERVAL HISTORY: Diane Franco 56 y.o. female returns for her yearly followup regarding her history of left breast cancer that was T1 N1 with micrometastatic disease in one of 6 nodes with mastectomy and axillary node evaluation in February 2002 when she was premenopausal. The tumor was ER/PR positive and HER-2 negative. She was treated with adjuvant a.c./Taxol followed by 5 years of tamoxifen through December of 2007 and is been on observation since then. She status post hysterectomy without oophorectomy at that was performed in March of 2009 for uterine prolapse. Per Dr. Melvyn Neth is note dated 10/06/2009 at that time the patient was almost 9 years out from diagnosis and with her extent of treatment to that point and premenopausal status aromatase inhibitors were not pursued any further. Patient reports that since being seen in our office a year ago she had both the flu as well as shingles in early September of 2013. Shingles affecting her left lower leg. She was treated with a course of Valtrex. She's not had any hospitalizations or surgeries. She did not get her screening mammogram when it was scheduled.  MEDICAL HISTORY:History reviewed. No pertinent past medical history.  ALLERGIES:  is allergic to codeine.  MEDICATIONS:  No current outpatient prescriptions on file.     SURGICAL HISTORY: History reviewed. No pertinent past surgical history.  REVIEW OF SYSTEMS:  A comprehensive review of systems was negative.   PHYSICAL EXAMINATION: General appearance: alert, cooperative, appears stated age and no distress Head: Normocephalic, without obvious abnormality, atraumatic Neck: no adenopathy, no carotid bruit, no JVD, supple, symmetrical, trachea midline and thyroid not enlarged, symmetric, no tenderness/mass/nodules Lymph nodes: Cervical, supraclavicular, and axillary nodes normal. Resp: clear to auscultation bilaterally Back: symmetric, no curvature. ROM normal. No CVA tenderness. Cardio: regular rate and rhythm, S1, S2 normal, no murmur, click, rub or gallop GI: soft, non-tender; bowel sounds normal; no masses,  no organomegaly Extremities: extremities normal, atraumatic, no cyanosis or edema Neurologic: Alert and oriented X 3, normal strength and tone. Normal symmetric reflexes. Normal coordination and gait Breasts: examination of the left TRAM is unremarkable, there is nothing palpable in the left axilla and the left upper extremity edema. Examination of the right breast reveals no dominant masses, skin changes or nipple discharge. He is nothing palpable in the right axilla and no right upper extremity edema.  ECOG PERFORMANCE STATUS: 0 - Asymptomatic  Blood pressure 169/76, pulse 78, temperature 98.1 F (36.7 C), temperature source Oral, resp. rate 20, height 5' 2.5" (1.588 m), weight 186 lb 14.4 oz (84.777 kg).  LABORATORY DATA: Lab Results  Component Value Date   WBC 7.9 09/03/2012   HGB 12.7 09/03/2012   HCT 37.8 09/03/2012   MCV 96.6 09/03/2012   PLT 249 09/03/2012      Chemistry      Component Value Date/Time   NA 138 09/03/2012 1525   NA 139 07/25/2011  1416   K 3.9 09/03/2012 1525   K 3.9 07/25/2011 1416   CL 104 09/03/2012 1525   CL 102 07/25/2011 1416   CO2 24 09/03/2012 1525   CO2 27 07/25/2011 1416   BUN 25.0 09/03/2012 1525   BUN 15  07/25/2011 1416   CREATININE 1.1 09/03/2012 1525   CREATININE 0.93 07/25/2011 1416      Component Value Date/Time   CALCIUM 9.5 09/03/2012 1525   CALCIUM 10.0 07/25/2011 1416   ALKPHOS 70 09/03/2012 1525   ALKPHOS 63 07/25/2011 1416   AST 15 09/03/2012 1525   AST 15 07/25/2011 1416   ALT 19 09/03/2012 1525   ALT 14 07/25/2011 1416   BILITOT 0.22 09/03/2012 1525   BILITOT 0.2* 07/25/2011 1416       RADIOGRAPHIC STUDIES:  No results found.   ASSESSMENT/PLAN: Patient is a very pleasant 56 year old African American female with history of T1 N1 left breast carcinoma diagnosed had wary of 2002 completely described as above. The disease is not known to be recurrent. Patient was admonished for not keeping up with her screening mammograms. We will schedule her to have bilateral screening mammograms soon as there is an available appointment slot. Hypertension runs in her family her blood pressure was elevated today. She is encouraged to followup with her primary care physician for followup of her elevated blood pressure. Patient was discussed with Dr. Darrold Span. She'll follow with Dr. Darrold Span in one year with a repeat CBC differential and C. met.     Laural Benes, [name], PA-C     All questions were answered. The patient knows to call the clinic with any problems, questions or concerns. We can certainly see the patient much sooner if necessary.  I spent 20 minutes counseling the patient face to face. The total time spent in the appointment was 30 minutes.

## 2012-09-27 ENCOUNTER — Ambulatory Visit: Payer: BC Managed Care – PPO

## 2012-10-30 ENCOUNTER — Ambulatory Visit: Payer: BC Managed Care – PPO

## 2013-01-07 ENCOUNTER — Ambulatory Visit: Payer: BC Managed Care – PPO

## 2013-01-07 ENCOUNTER — Ambulatory Visit (INDEPENDENT_AMBULATORY_CARE_PROVIDER_SITE_OTHER): Payer: BC Managed Care – PPO | Admitting: Family Medicine

## 2013-01-07 VITALS — BP 154/109 | HR 82 | Temp 98.3°F | Resp 16 | Ht 63.0 in | Wt 189.4 lb

## 2013-01-07 DIAGNOSIS — R109 Unspecified abdominal pain: Secondary | ICD-10-CM

## 2013-01-07 DIAGNOSIS — I10 Essential (primary) hypertension: Secondary | ICD-10-CM

## 2013-01-07 DIAGNOSIS — M25512 Pain in left shoulder: Secondary | ICD-10-CM

## 2013-01-07 DIAGNOSIS — R404 Transient alteration of awareness: Secondary | ICD-10-CM

## 2013-01-07 DIAGNOSIS — M25519 Pain in unspecified shoulder: Secondary | ICD-10-CM

## 2013-01-07 LAB — POCT CBC
Granulocyte percent: 48.1 %G (ref 37–80)
HCT, POC: 40.6 % (ref 37.7–47.9)
POC Granulocyte: 3.3 (ref 2–6.9)
POC LYMPH PERCENT: 44.2 %L (ref 10–50)
Platelet Count, POC: 270 10*3/uL (ref 142–424)
RBC: 4.17 M/uL (ref 4.04–5.48)
RDW, POC: 15.1 %

## 2013-01-07 LAB — BASIC METABOLIC PANEL
Potassium: 3.8 mEq/L (ref 3.5–5.3)
Sodium: 139 mEq/L (ref 135–145)

## 2013-01-07 MED ORDER — CYCLOBENZAPRINE HCL 10 MG PO TABS: 10.0000 mg | ORAL_TABLET | Freq: Two times a day (BID) | ORAL | Status: DC | PRN

## 2013-01-07 MED ORDER — METAXALONE 800 MG PO TABS: 800.0000 mg | ORAL_TABLET | Freq: Three times a day (TID) | ORAL | Status: DC

## 2013-01-07 MED ORDER — LISINOPRIL-HYDROCHLOROTHIAZIDE 10-12.5 MG PO TABS: 1.0000 | ORAL_TABLET | Freq: Every day | ORAL | Status: DC

## 2013-01-07 NOTE — Progress Notes (Addendum)
[facility] [address] [place] [address] [phone]  Date:  01/07/2013   Name:  Diane Franco   DOB:  1956-05-23   MRN:  [id number]  PCP:  No primary provider on file.    Chief Complaint: Hypertension and Arm Pain   History of Present Illness:  Diane Franco is a 57 y.o. very pleasant female patient who presents with the following:  Today is Monday.  This past Thursday she noted pain in her abdomen- she had to bend over to "bear the pain."  This pain came on her suddenly, lasted about one hour and then resolved.  The abdominal pain has not come back.  She has not noted any nausea, vomiting or diarrhea, able to eat ok.    On Friday she noted pain in the back of her left shoulder.  It is "worse than a toothache" and makes her feel "weak."   This pain is constant "I'm in pain now." Not worse with certain positions. Not worse with laying down at night She has a history of a pinched nerve in her neck, but this feels different  She has not noted any chest pain No SOB  She has been on treatment for her BP in the past- she does not remember what she was taking.  She has been off of her medication for about one year.  She did not pass her recent DOT exam  In December her BP was 167/ 76 at her oncologist's office  She was diagnosed with breast cancer in 2002.  She had a left mastectomy and reconstruction.   She is doing very well in this regard. Follows up yearly.  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   She has been a bus driver for 25 years.    Patient Active Problem List  Diagnosis  . Breast cancer  . Other screening mammogram    Past Medical History  Diagnosis Date  . Cataract     Past Surgical History  Procedure Laterality Date  . Breast surgery      History  Substance Use Topics  . Smoking status: Never Smoker   . Smokeless tobacco: Not on file  .  Alcohol Use: Yes     Comment: once a month     Family History  Problem Relation Age of Onset  . Diabetes Sister   . Cancer Brother   . Hypertension Sister   . Hypertension Sister   . Hypertension Brother   . Hypertension Brother   . Seizures Son     Allergies  Allergen Reactions  . Codeine Other (See Comments)    dizziness    Medication list has been reviewed and updated.  No current outpatient prescriptions on file prior to visit.   No current facility-administered medications on file prior to visit.    Review of Systems:  As per HPI- otherwise negative.   Physical Examination: Filed Vitals:   01/07/13 1549  BP: 154/109  Pulse: 82  Temp: 98.3 F (36.8 C)  Resp: 16   Filed Vitals:   01/07/13 1549  Height: 5\' 3"  (1.6 m)  Weight: 189 lb 6.4 oz (85.911 kg)   Body mass index is 33.56 kg/(m^2). Ideal Body Weight: Weight in (lb) to have BMI = 25: 140.8  GEN: WDWN, NAD, Non-toxic, A & O x 3, obese HEENT: Atraumatic, Normocephalic. Neck supple. No masses, No  LAD.  Bilateral TM wnl, oropharynx normal.  PEERL,EOMI.   Ears and Nose: No external deformity. CV: RRR, No M/G/R. No JVD. No thrill. No extra heart sounds. PULM: CTA B, no wheezes, crackles, rhonchi. No retractions. No resp. distress. No accessory muscle use. ABD: S, NT, ND, +BS. No rebound. No HSM.  Abdomen is currently benign EXTR: No c/c/e NEURO Normal gait.  PSYCH: Normally interactive. Conversant. Not depressed or anxious appearing.  Calm demeanor.  Able to reproduce her left shoulder pain- she is tender over the left shoulder blade, and the rhomboid muscles are tight and in spasm. I am able to reproduce the same pain that she has noted by pressing on this area.    [facility] reading (PRIMARY) by  Dr. Patsy Lager. CXR: normal, history of breast surgery Abdominal series: negative, history of hysterectomy  ABDOMEN - 2 VIEW  Comparison: CT of the abdomen pelvis of 08/06/2004  Findings: Supine and erect views  of the abdomen show no bowel obstruction. No free air is seen on the erect view. Surgical clips overlie the left abdomen and right lower quadrant. No opaque calculi are seen. No bony abnormality is noted.  IMPRESSION: No bowel obstruction. No free air  CHEST - 2 VIEW  Comparison: None.  Findings: The lungs are clear. Heart size is normal. No pneumothorax or pleural fluid. Surgical clips in the left breast and axilla are noted. No focal bony abnormality.  IMPRESSION: No acute disease.   EKG: NSR, no ST elevation or depression Results for orders placed in visit on 01/07/13  POCT CBC      Result Value Range   WBC 6.8  4.6 - 10.2 K/uL   Lymph, poc 3.0  0.6 - 3.4   POC LYMPH PERCENT 44.2  10 - 50 %L   MID (cbc) 0.5  0 - 0.9   POC MID % 7.7  0 - 12 %M   POC Granulocyte 3.3  2 - 6.9   Granulocyte percent 48.1  37 - 80 %G   RBC 4.17  4.04 - 5.48 M/uL   Hemoglobin 13.0  12.2 - 16.2 g/dL   HCT, POC 16.1  09.6 - 47.9 %   MCV 97.3 (*) 80 - 97 fL   MCH, POC 31.2  27 - 31.2 pg   MCHC 32.0  31.8 - 35.4 g/dL   RDW, POC 04.5     Platelet Count, POC 270  142 - 424 K/uL   MPV 8.9  0 - 99.8 fL    Assessment and Plan: Pain in left shoulder - Plan: DG Chest 2 View, EKG 12-Lead, metaxalone (SKELAXIN) 800 MG tablet, DISCONTINUED: cyclobenzaprine (FLEXERIL) 10 MG tablet  Abdominal  pain, other specified site - Plan: POCT CBC, Basic metabolic panel, DG Abd 2 Views  Unspecified essential hypertension - Plan: lisinopril-hydrochlorothiazide (PRINZIDE,ZESTORETIC) 10-12.5 MG per tablet  Start on lisinopril/ HCTZ for HTN.  Use skelaxin as needed for her shoulder pain- she is having MSK pain, no evidence of cardiopulmonary issues.    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

## 2013-01-07 NOTE — Patient Instructions (Addendum)
Please come and see Diane Franco in about one week for a BP recheck.  Remember that the muscle relaxer can make you drowsy so do not take it when you are driving  Let Diane Franco know if your shoulder is not feeling better in the next few days- Sooner if worse.

## 2013-01-08 ENCOUNTER — Telehealth: Payer: Self-pay

## 2013-01-08 ENCOUNTER — Encounter: Payer: Self-pay | Admitting: Family Medicine

## 2013-01-08 NOTE — Telephone Encounter (Signed)
Left message for Diane Franco that [facility] the left breast. Will send orders  to schedulers for an appointment in the next few weeks.  Will call patient if follow up is needed sooner than the appointment date.

## 2013-01-09 ENCOUNTER — Telehealth: Payer: Self-pay | Admitting: Oncology

## 2013-01-16 ENCOUNTER — Other Ambulatory Visit: Payer: Self-pay | Admitting: Physician Assistant

## 2013-01-16 DIAGNOSIS — N644 Mastodynia: Secondary | ICD-10-CM

## 2013-01-16 DIAGNOSIS — Z9012 Acquired absence of left breast and nipple: Secondary | ICD-10-CM

## 2013-01-30 ENCOUNTER — Ambulatory Visit
Admission: RE | Admit: 2013-01-30 | Discharge: 2013-01-30 | Disposition: A | Discharge status: Home / Self Care | Payer: BC Managed Care – PPO | Source: Ambulatory Visit | Attending: Physician Assistant | Admitting: Physician Assistant

## 2013-01-30 ENCOUNTER — Other Ambulatory Visit: Payer: Self-pay | Admitting: Physician Assistant

## 2013-01-30 DIAGNOSIS — N644 Mastodynia: Secondary | ICD-10-CM

## 2013-01-30 DIAGNOSIS — Z9012 Acquired absence of left breast and nipple: Secondary | ICD-10-CM

## 2013-01-30 DIAGNOSIS — Z853 Personal history of malignant neoplasm of breast: Secondary | ICD-10-CM

## 2013-01-31 ENCOUNTER — Ambulatory Visit (INDEPENDENT_AMBULATORY_CARE_PROVIDER_SITE_OTHER): Payer: BC Managed Care – PPO | Admitting: Family Medicine

## 2013-01-31 VITALS — BP 140/80 | HR 80 | Temp 97.8°F | Resp 16 | Ht 64.0 in | Wt 189.0 lb

## 2013-01-31 DIAGNOSIS — I10 Essential (primary) hypertension: Secondary | ICD-10-CM

## 2013-01-31 NOTE — Progress Notes (Signed)
[facility]:  Office Visit  Chief Complaint:  Chief Complaint  Patient presents with  . Hypertension    HPI: Zeniyah Peaster is a 57 y.o. female who complains of here for DOT recheck of HTN. She is actually here for a recheck of her HTN. She was seen at [facility] for a DOT PE but was only given a 3 month provisionary recert.  She was then seen by [place] to get started on BP meds as needed. She was started on Lisinopril -HCTZ 10/12.5 mg.  She has been compliant with her medications. Denies any SEs. She is a nonsmoker, denies Etoh.  She was given a 1 month supply of meds and has been taking the pills daily.   Past Medical History  Diagnosis Date  . Cataract   . Hypertension    Past Surgical History  Procedure Laterality Date  . Breast surgery     History   Social History  . Marital Status: Married    Spouse Name: N/A    Number of Children: N/A  . Years of Education: N/A   Social History Main Topics  . Smoking status: Never Smoker   . Smokeless tobacco: None  . Alcohol Use: Yes     Comment: once a month   . Drug Use: No  . Sexually Active: Yes    Birth Control/ Protection: None   Other Topics Concern  . None   Social History Narrative  . None   Family History  Problem Relation Age of Onset  . Diabetes Sister   . Cancer Brother   . Hypertension Sister   . Hypertension Sister   . Hypertension Brother   . Hypertension Brother   . Seizures Son    Allergies  Allergen Reactions  . Codeine Other (See Comments)    dizziness   Prior to Admission medications   Medication Sig Start Date End Date Taking? Authorizing Provider  lisinopril-hydrochlorothiazide (PRINZIDE,ZESTORETIC) 10-12.5 MG per tablet Take 1 tablet by mouth daily. 01/07/13  Yes Gwenlyn Found Copland, MD  metaxalone (SKELAXIN) 800 MG tablet Take 1 tablet (800 mg total) by mouth 3 (three) times daily. 01/07/13   Gwenlyn Found Copland, MD     ROS: The patient denies fevers, chills, night  sweats, unintentional weight loss, chest pain, palpitations, wheezing, dyspnea on exertion, nausea, vomiting, abdominal pain, dysuria, hematuria, melena, numbness, weakness, or tingling.   All other systems have been reviewed and were otherwise negative with the exception of those mentioned in the HPI and as above.    PHYSICAL EXAM: Filed Vitals:   01/31/13 1607  BP: 140/80  Pulse: 80  Temp: 97.8 F (36.6 C)  Resp: 16   Filed Vitals:   01/31/13 1607  Height: 5\' 4"  (1.626 m)  Weight: 189 lb (85.73 kg)   Body mass index is 32.43 kg/(m^2).  General: Alert, no acute distress, obese HEENT:  Normocephalic, atraumatic, oropharynx patent. EOMI, PERRLA, fundoscopic exam nl Cardiovascular:  Regular rate and rhythm, no rubs murmurs or gallops.  No Carotid bruits, radial pulse intact. No pedal edema.  Respiratory: Clear to auscultation bilaterally.  No wheezes, rales, or rhonchi.  No cyanosis, no use of accessory musculature GI: No organomegaly, abdomen is soft and non-tender, positive bowel sounds.  No masses. Skin: No rashes. Neurologic: Facial musculature symmetric. Psychiatric: Patient is appropriate throughout our interaction. Lymphatic: No cervical lymphadenopathy Musculoskeletal: Gait intact.   LABS: Results for orders placed in visit on 01/07/13  BASIC METABOLIC PANEL  Result Value Range   Sodium 139  135 - 145 mEq/L   Potassium 3.8  3.5 - 5.3 mEq/L   Chloride 104  96 - 112 mEq/L   CO2 28  19 - 32 mEq/L   Glucose, Bld 92  70 - 99 mg/dL   BUN 15  6 - 23 mg/dL   Creat 1.61  0.96 - 0.45 mg/dL   Calcium 40.9  8.4 - 81.1 mg/dL  POCT CBC      Result Value Range   WBC 6.8  4.6 - 10.2 K/uL   Lymph, poc 3.0  0.6 - 3.4   POC LYMPH PERCENT 44.2  10 - 50 %L   MID (cbc) 0.5  0 - 0.9   POC MID % 7.7  0 - 12 %M   POC Granulocyte 3.3  2 - 6.9   Granulocyte percent 48.1  37 - 80 %G   RBC 4.17  4.04 - 5.48 M/uL   Hemoglobin 13.0  12.2 - 16.2 g/dL   HCT, POC 91.4  78.2 - 47.9 %    MCV 97.3 (*) 80 - 97 fL   MCH, POC 31.2  27 - 31.2 pg   MCHC 32.0  31.8 - 35.4 g/dL   RDW, POC 95.6     Platelet Count, POC 270  142 - 424 K/uL   MPV 8.9  0 - 99.8 fL     EKG/XRAY:   Primary read interpreted by Dr. Conley Rolls at [facility].   ASSESSMENT/PLAN: Encounter Diagnosis  Name Primary?  . HTN (hypertension) Yes   Patient is compliant with meds BP is better controlled Repeat BMP pending F/u in 6 months or sooner prn DASH diet, increase exercise    Nikesh Teschner PHUONG, DO 01/31/2013 4:48 PM

## 2013-01-31 NOTE — Patient Instructions (Signed)

## 2013-02-01 ENCOUNTER — Other Ambulatory Visit: Payer: Self-pay | Admitting: Family Medicine

## 2013-02-01 DIAGNOSIS — N289 Disorder of kidney and ureter, unspecified: Secondary | ICD-10-CM

## 2013-02-01 LAB — BASIC METABOLIC PANEL
BUN: 22 mg/dL (ref 6–23)
Creat: 1.26 mg/dL — ABNORMAL HIGH (ref 0.50–1.10)
Potassium: 4.1 mEq/L (ref 3.5–5.3)

## 2013-02-01 LAB — BASIC METABOLIC PANEL WITH GFR
CO2: 27 meq/L (ref 19–32)
Calcium: 9.8 mg/dL (ref 8.4–10.5)
Chloride: 103 meq/L (ref 96–112)
Glucose, Bld: 94 mg/dL (ref 70–99)
Sodium: 138 meq/L (ref 135–145)

## 2013-02-04 ENCOUNTER — Ambulatory Visit: Payer: BC Managed Care – PPO

## 2013-02-05 ENCOUNTER — Ambulatory Visit (HOSPITAL_BASED_OUTPATIENT_CLINIC_OR_DEPARTMENT_OTHER): Payer: BC Managed Care – PPO | Admitting: Oncology

## 2013-02-05 ENCOUNTER — Encounter: Payer: Self-pay | Admitting: Oncology

## 2013-02-05 VITALS — BP 135/76 | HR 92 | Temp 98.3°F | Resp 18 | Ht 64.0 in | Wt 190.0 lb

## 2013-02-05 DIAGNOSIS — C50912 Malignant neoplasm of unspecified site of left female breast: Secondary | ICD-10-CM

## 2013-02-05 DIAGNOSIS — Z853 Personal history of malignant neoplasm of breast: Secondary | ICD-10-CM

## 2013-02-05 DIAGNOSIS — Z1231 Encounter for screening mammogram for malignant neoplasm of breast: Secondary | ICD-10-CM

## 2013-02-05 NOTE — Patient Instructions (Signed)
Mammogram in early May 2015  Increase fluids

## 2013-02-05 NOTE — Progress Notes (Signed)
OFFICE PROGRESS NOTE   02/05/2013   Physicians: J.Copeland, J.Jenkins, (T.Gerkin)  INTERVAL HISTORY:   Patient is seen, alone for visit, in follow up of history of left breast cancer, particularly with recent discomfort in neck and left shoulder. Patient tells me that the discomfort has improved with interventions by PCP and is related to known cervical disc disease. She has been seen back by Dr Peggye Ley and expects to have neurosurgical intervention in next couple of months. She had bilateral mammograms  01-30-13, with no findings of concern including left TRAM. Last breast MRI was 09-2010. Breast tissue is not dense now per the mammogram report. PCP now is Dr [facility]ccreedy [facility]   Oncologic History is of T1N1 left breast cancer with micrometastatic disease in 1 or 6 axillary nodes in February 2002, patient premenopausal then. The cancer was ER/PR + and Her2 negative. She had mastectomy with 6 node axillary evaluation, adjuvant adriamycin/cytoxan/taxol followed by 5 years of tamoxifen thru Dec 2007. She has been on observation since then.She had hysterectomy without oophorectomy in March 2009 for uterine prolapse.    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 She has had no recent infectious illness. She denies respiratory, cardiac, GI complaints. She continues significant hot flashes. Note she had zoster LLE in past year. No bleeding. Energy good. Remainder of 10 point Review of Systems negative.  Objective:  Vital signs in last 24 hours:  BP 135/76  Pulse 92  Temp(Src) 98.3 F (36.8 C) (Oral)  Resp 18  Ht 5\' 4"  (1.626 m)  Wt 190 lb (86.183 kg)  BMI 32.6 kg/m2 Weight is up 3 lbs. Easily ambulatory, very pleasant as always, looks comfortable.   HEENT:PERRLA, extra ocular movement intact, sclera clear, anicteric and oropharynx clear, no lesions LymphaticsCervical, supraclavicular, and axillary nodes normal. Resp: clear  to auscultation bilaterally and normal percussion bilaterally Cardio: regular rate and rhythm GI: soft, non-tender; bowel sounds normal; no masses,  no organomegaly Extremities: extremities normal, atraumatic, no cyanosis or edema Skin without rash or ecchymoses Breast:normal without suspicious masses, skin or nipple changes or axillary nodes and left TRAM not remarkable. Right breast without dominant mass, skin or nipple findings   Lab Results:  Results for orders placed in visit on 01/31/13  BASIC METABOLIC PANEL      Result Value Range   Sodium 138  135 - 145 mEq/L   Potassium 4.1  3.5 - 5.3 mEq/L   Chloride 103  96 - 112 mEq/L   CO2 27  19 - 32 mEq/L   Glucose, Bld 94  70 - 99 mg/dL   BUN 22  6 - 23 mg/dL   Creat 0.98 (*) 1.19 - 1.10 mg/dL   Calcium 9.8  8.4 - 14.7 mg/dL    CBC diff [id number] normal. Creatinine 09-2012 1.1  Studies/Results: Clinical Data: Patient presents for a bilateral diagnostic  mammogram due to pain of the left shoulder and arm as patient has a  history of a prior left mastectomy with TRAM flap reconstruction  2002.  DIGITAL DIAGNOSTIC BILATERAL MAMMOGRAM WITH CAD  Comparison: 06/23/2011, 06/21/2010, 06/16/2009, 07/28/2008 and  07/26/2007  Findings:  ACR Breast Density Category 2: There is a scattered fibroglandular  pattern.  The left mastectomy/TRAM flap is unchanged with no suspicious focal  abnormality. Right breast is unchanged.  Mammographic images were processed with CAD.  IMPRESSION:  Stable mammogram. No focal abnormality of the left mastectomy/TRAM  flap.  RECOMMENDATION:  Recommend continued annual screening mammographic follow-up of the  right breast. Also recommend continued management of patient's  left shoulder/arm pain on a clinical basis.  I have discussed the findings and recommendations with the patient.  Results were also provided in writing at the conclusion of the  visit. If applicable, a reminder letter will be sent to the   patient regarding her next appointment.  BI-RADS CATEGORY 1: Negative.   Medications: I have reviewed the patient's current medications. She is on lisinopril/HCTZ and probably needs to increase po fluids.  Assessment/Plan: 1.left breast cancer 2002 when premenopausal: ER PR +, HER 2 -, post treatment as above and now on observation. I will see her back in a year or sooner if needed. If doing well then, and as she is now established with PCP, could probably change follow up at this office to prn then. Mammograms yearly. Blood counts yearly due to previous chemo. 2.cervical disc disease causing neck and shoulder pain. Surgery by Dr Lovell Sheehan planned 3.HTN on medication including diuretic. Note creatinine slightly higher. I will cc this note with labs to PCP 4.post hysterectomy without oophorectomy: still need pelvic exams yearly with ovaries in and breast cancer history.   Patient is in agreement with plan above. Kyllian Clingerman P, MD   02/05/2013, 5:30 PM

## 2013-02-07 ENCOUNTER — Telehealth: Payer: Self-pay | Admitting: Oncology

## 2013-02-07 NOTE — Telephone Encounter (Signed)
lvm for pt regarding to cancelled 12.2.14 lab and est per Dr. Bonner Puna about 5.1.14 mammo and lab and est...mailed pt updated appt sched and letter

## 2013-04-16 ENCOUNTER — Telehealth: Payer: Self-pay | Admitting: Hematology and Oncology

## 2013-04-16 NOTE — Telephone Encounter (Signed)
LMONVM ADVISING THE PT OF HER MAY 2015 APPTS

## 2013-09-03 ENCOUNTER — Other Ambulatory Visit: Payer: BC Managed Care – PPO

## 2013-09-03 ENCOUNTER — Ambulatory Visit: Payer: BC Managed Care – PPO | Admitting: Oncology

## 2013-10-08 ENCOUNTER — Ambulatory Visit (INDEPENDENT_AMBULATORY_CARE_PROVIDER_SITE_OTHER): Payer: BC Managed Care – PPO | Admitting: Family Medicine

## 2013-10-08 VITALS — BP 178/110 | HR 73 | Temp 97.9°F | Resp 18 | Wt 191.6 lb

## 2013-10-08 DIAGNOSIS — I10 Essential (primary) hypertension: Secondary | ICD-10-CM

## 2013-10-08 DIAGNOSIS — F4321 Adjustment disorder with depressed mood: Secondary | ICD-10-CM

## 2013-10-08 DIAGNOSIS — Z79899 Other long term (current) drug therapy: Secondary | ICD-10-CM

## 2013-10-08 DIAGNOSIS — F5105 Insomnia due to other mental disorder: Secondary | ICD-10-CM

## 2013-10-08 DIAGNOSIS — Z634 Disappearance and death of family member: Secondary | ICD-10-CM

## 2013-10-08 MED ORDER — LISINOPRIL-HYDROCHLOROTHIAZIDE 20-25 MG PO TABS: 1.0000 | ORAL_TABLET | Freq: Every day | ORAL | Status: DC

## 2013-10-08 MED ORDER — CLONAZEPAM 0.5 MG PO TABS: 0.5000 mg | ORAL_TABLET | Freq: Two times a day (BID) | ORAL | Status: DC | PRN

## 2013-10-08 NOTE — Patient Instructions (Signed)
Restart your blood pressure medication.  Make sure you are taking a 30 minute walk every day.  Make yourself eat at least 3 small meals/day.  Take the sleeping medication every night as needed around 9 p.m.  Set your alarm to wake up at the same time everyday - even if you haven't slept well make yourself get up and do not nap during the day - if you feel sleepy - go for another walk.  If you are laying awake in bed at night, get up out of bed and read something - like the newspaper or bible. Insomnia Insomnia is frequent trouble falling and/or staying asleep. Insomnia can be a long term problem or a short term problem. Both are common. Insomnia can be a short term problem when the wakefulness is related to a certain stress or worry. Long term insomnia is often related to ongoing stress during waking hours and/or poor sleeping habits. Overtime, sleep deprivation itself can make the problem worse. Every little thing feels more severe because you are overtired and your ability to cope is decreased. CAUSES   Stress, anxiety, and depression.  Poor sleeping habits.  Distractions such as TV in the bedroom.  Naps close to bedtime.  Engaging in emotionally charged conversations before bed.  Technical reading before sleep.  Alcohol and other sedatives. They may make the problem worse. They can hurt normal sleep patterns and normal dream activity.  Stimulants such as caffeine for several hours prior to bedtime.  Pain syndromes and shortness of breath can cause insomnia.  Exercise late at night.  Changing time zones may cause sleeping problems (jet lag). It is sometimes helpful to have someone observe your sleeping patterns. They should look for periods of not breathing during the night (sleep apnea). They should also look to see how long those periods last. If you live alone or observers are uncertain, you can also be observed at a sleep clinic where your sleep patterns will be professionally  monitored. Sleep apnea requires a checkup and treatment. Give your caregivers your medical history. Give your caregivers observations your family has made about your sleep.  SYMPTOMS   Not feeling rested in the morning.  Anxiety and restlessness at bedtime.  Difficulty falling and staying asleep. TREATMENT   Your caregiver may prescribe treatment for an underlying medical disorders. Your caregiver can give advice or help if you are using alcohol or other drugs for self-medication. Treatment of underlying problems will usually eliminate insomnia problems.  Medications can be prescribed for short time use. They are generally not recommended for lengthy use.  Over-the-counter sleep medicines are not recommended for lengthy use. They can be habit forming.  You can promote easier sleeping by making lifestyle changes such as:  Using relaxation techniques that help with breathing and reduce muscle tension.  Exercising earlier in the day.  Changing your diet and the time of your last meal. No night time snacks.  Establish a regular time to go to bed.  Counseling can help with stressful problems and worry.  Soothing music and white noise may be helpful if there are background noises you cannot remove.  Stop tedious detailed work at least one hour before bedtime. HOME CARE INSTRUCTIONS   Keep a diary. Inform your caregiver about your progress. This includes any medication side effects. See your caregiver regularly. Take note of:  Times when you are asleep.  Times when you are awake during the night.  The quality of your sleep.  How you  feel the next day. This information will help your caregiver care for you.  Get out of bed if you are still awake after 15 minutes. Read or do some quiet activity. Keep the lights down. Wait until you feel sleepy and go back to bed.  Keep regular sleeping and waking hours. Avoid naps.  Exercise regularly.  Avoid distractions at bedtime.  Distractions include watching television or engaging in any intense or detailed activity like attempting to balance the household checkbook.  Develop a bedtime ritual. Keep a familiar routine of bathing, brushing your teeth, climbing into bed at the same time each night, listening to soothing music. Routines increase the success of falling to sleep faster.  Use relaxation techniques. This can be using breathing and muscle tension release routines. It can also include visualizing peaceful scenes. You can also help control troubling or intruding thoughts by keeping your mind occupied with boring or repetitive thoughts like the old concept of counting sheep. You can make it more creative like imagining planting one beautiful flower after another in your backyard garden.  During your day, work to eliminate stress. When this is not possible use some of the previous suggestions to help reduce the anxiety that accompanies stressful situations. MAKE SURE YOU:   Understand these instructions.  Will watch your condition.  Will get help right away if you are not doing well or get worse. Document Released: 09/16/2000 Document Revised: 12/12/2011 Document Reviewed: 10/17/2007 [facility] Patient Information 2014 [place].

## 2013-10-08 NOTE — Progress Notes (Deleted)
This chart was scribed for Laurey Arrow. Brigitte Pulse, MD by Einar Pheasant, ED Scribe. This patient was seen in room 4 and the patient's care was started at 9:27 AM. Subjective:    Patient ID: Diane Franco, female    DOB: 06-Jul-1956, 58 y.o.   MRN: [id number]  Chief Complaint  Patient presents with   Follow-up    FMLA paperwork   Hypertension   Depression    HPI HPI Comments: Diane Franco is a 58 y.o. female who presents to [facility] here for evaluation of her hypertension.  She states that her son passed August 22, 2013. He was 52 years old and has been mourning him since then - just torn apart. She states that  her son had a seizure in the bathroom of [facility] where he collapsed and hit his head. After the autopsy they discovered that he had suffered multiple seizure and that he passed from trauma to his head. He had a known seizure d/o and was hospitalized in [place] to actually have surgery - they thought he might be able to be cured of his epilepsy - so she was thinking he was going to get a lot better and instead he hit his head during a seizure before he could have the surgery. She felt like life was the most hopeful it had been for him and it was suddenly ripped away. Since his death its been really hard for her to take care of herself.  Pt states that she stopped taking her blood pressure medication. She states that her and her family are undergoing bereavement counseling once a week. Pt reports being off of work since her son passed (08/22/13). She states that she does not feel like she is ready to return to work, but maybe in February. 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  Pt is also complaining of associate sleep disturbances and weight gain.   Pt is also requesting for her FMLA paperwork to be completed.   Past Medical History  Diagnosis Date   Cataract    Hypertension     Allergies  Allergen Reactions   Codeine Other (See Comments)    dizziness   Current Outpatient Prescriptions on File Prior to Visit  Medication Sig Dispense Refill   lisinopril-hydrochlorothiazide (PRINZIDE,ZESTORETIC) 10-12.5 MG per tablet Take 1 tablet by mouth daily.  30 tablet  3   No current facility-administered medications on file prior to visit.   Review of Systems  Constitutional: Positive for fatigue and unexpected weight change. Negative for fever, chills, diaphoresis and appetite change.  Eyes: Negative for visual disturbance.  Respiratory: Negative for cough and shortness of breath.   Cardiovascular: Negative for chest pain, palpitations and leg swelling.  Genitourinary: Negative for decreased urine volume.  Neurological: Negative for syncope and headaches.  Hematological: Does not bruise/bleed easily.  Psychiatric/Behavioral: Positive for sleep disturbance, dysphoric mood and decreased concentration. Negative for suicidal ideas, self-injury and agitation. The patient is not nervous/anxious and is not hyperactive.       Triage vitals: BP 178/110   Pulse 73   Temp(Src) 97.9 F (36.6 C) (Oral)   Resp 18   Wt 191 lb 9.6 oz (86.909 kg)   SpO2 99% Objective:   Physical Exam  Nursing note and vitals reviewed. Constitutional: She is oriented to person, place, and time. She appears well-developed and well-nourished.  HENT:  Head: Normocephalic and atraumatic.  Cardiovascular: Normal rate, regular rhythm and normal heart sounds.   No murmur heard. Pulmonary/Chest: Effort normal and breath sounds normal. No respiratory distress. She has no wheezes. She has no rales.  Abdominal: She exhibits no distension.  Neurological: She is alert and oriented to person, place, and time.  Skin: Skin is warm and dry.  Psychiatric: She has a normal mood and affect.      Assessment & Plan:  9:31 AM-  Unspecified essential hypertension - Pt will restart lisinopril-hctz. Recheck in 1  mo.  Grief at loss of child - spent extensive time counseling pt on importance of self-care. She has to start taking care of herself again - taking her BP meds, eating regularly, getting sleep back onto reg schedule so that she can be there for her other children.  Advised pt that she needs to be more active this upcoming month. She needs to make some commitment to herself and her family to take care of herself. Also advised her to walk around her neighborhood in order to get some exercise in. Pt advised of plan for treatment and pt agrees.  Insomnia due to mental disorder - try short term prn klonopin to get sleep sched back on track. FMLA papers completed for pt.   Meds ordered this encounter  Medications   lisinopril-hydrochlorothiazide (PRINZIDE,ZESTORETIC) 20-25 MG per tablet    Sig: Take 1 tablet by mouth daily.    Dispense:  30 tablet    Refill:  1   clonazePAM (KLONOPIN) 0.5 MG tablet    Sig: Take 1 tablet (0.5 mg total) by mouth 3 times/day as needed-between meals & bedtime for anxiety (sleep).    Dispense:  20 tablet    Refill:  1    I personally performed the services described in this documentation, which was scribed in my presence. The recorded information has been reviewed and considered, and addended by me as needed.  Delman Cheadle, MD MPH

## 2013-10-23 NOTE — Progress Notes (Signed)
This chart was scribed for Diane Franco. Brigitte Pulse, MD by Einar Pheasant, ED Scribe. This patient was seen in room 4 and the patient's care was started at 9:27 AM. Subjective:    Patient ID: Diane Franco, female    DOB: August 26, 1956, 58 y.o.   MRN: [id number]  Chief Complaint  Patient presents with  . Follow-up    FMLA paperwork  . Hypertension  . Depression    Hypertension Pertinent negatives include no chest pain, headaches, palpitations or shortness of breath.   HPI Comments: Diane Franco is a 59 y.o. female who presents to [facility] here for evaluation of her hypertension.  She states that her son passed August 22, 2013. He was 19 years old and has been mourning him since then - just torn apart. She states that  her son had a seizure in the bathroom of [employer] where he collapsed and hit his head. After the autopsy they discovered that he had suffered multiple seizure and that he passed from trauma to his head. He had a known seizure d/o and was hospitalized in [place] to actually have surgery - they thought he might be able to be cured of his epilepsy - so she was thinking he was going to get a lot better and instead he hit his head during a seizure before he could have the surgery. She felt like life was the most hopeful it had been for him and it was suddenly ripped away. Since his death its been really hard for her to take care of herself.  Pt states that she stopped taking her blood pressure medication. She states that her and her family are undergoing bereavement counseling once a week. Pt reports being off of work since her son passed (08/22/13). She states that she does not feel like she is ready to return to work, but maybe in February. 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  Pt is also complaining of associate sleep disturbances and weight gain.   Pt is also requesting for her FMLA  paperwork to be completed.   Past Medical History  Diagnosis Date  . Cataract   . Hypertension    Allergies  Allergen Reactions  . Codeine Other (See Comments)    dizziness   No current outpatient prescriptions on file prior to visit.   No current facility-administered medications on file prior to visit.   Review of Systems  Constitutional: Positive for fatigue and unexpected weight change. Negative for fever, chills, diaphoresis and appetite change.  Eyes: Negative for visual disturbance.  Respiratory: Negative for cough and shortness of breath.   Cardiovascular: Negative for chest pain, palpitations and leg swelling.  Genitourinary: Negative for decreased urine volume.  Neurological: Negative for syncope and headaches.  Hematological: Does not bruise/bleed easily.  Psychiatric/Behavioral: Positive for sleep disturbance, dysphoric mood and decreased concentration. Negative for suicidal ideas, self-injury and agitation. The patient is not nervous/anxious and is not hyperactive.       Triage vitals: BP 178/110  Pulse 73  Temp(Src) 97.9 F (36.6 C) (Oral)  Resp 18  Wt 191 lb 9.6 oz (86.909 kg)  SpO2 99% Objective:   Physical Exam  Nursing note and vitals reviewed. Constitutional: She is oriented to person, place, and time. She appears well-developed and well-nourished.  HENT:  Head: Normocephalic and atraumatic.  Cardiovascular: Normal rate, regular rhythm and normal heart sounds.   No murmur  heard. Pulmonary/Chest: Effort normal and breath sounds normal. No respiratory distress. She has no wheezes. She has no rales.  Abdominal: She exhibits no distension.  Neurological: She is alert and oriented to person, place, and time.  Skin: Skin is warm and dry.  Psychiatric: She has a normal mood and affect.      Assessment & Plan:  9:31 AM-  Unspecified essential hypertension - Pt will restart lisinopril-hctz. Recheck in 2 wks.  Grief at loss of child - spent extensive  time counseling pt on importance of self-care. She has to start taking care of herself again - taking her BP meds, eating regularly, getting sleep back onto reg schedule so that she can be there for her other children.  Advised pt that she needs to be more active this upcoming month. She needs to make some commitment to herself and her family to take care of herself. Also advised her to walk around her neighborhood in order to get some exercise in. Pt advised of plan for treatment and pt agrees.  Insomnia due to mental disorder - try short term prn klonopin to get sleep sched back on track. FMLA papers completed for pt.   Meds ordered this encounter  Medications  . lisinopril-hydrochlorothiazide (PRINZIDE,ZESTORETIC) 20-25 MG per tablet    Sig: Take 1 tablet by mouth daily.    Dispense:  30 tablet    Refill:  1  . clonazePAM (KLONOPIN) 0.5 MG tablet    Sig: Take 1 tablet (0.5 mg total) by mouth 3 times/day as needed-between meals & bedtime for anxiety (sleep).    Dispense:  20 tablet    Refill:  1    I personally performed the services described in this documentation, which was scribed in my presence. The recorded information has been reviewed and considered, and addended by me as needed.  Delman Cheadle, MD MPH

## 2014-01-31 ENCOUNTER — Encounter (INDEPENDENT_AMBULATORY_CARE_PROVIDER_SITE_OTHER): Payer: Self-pay

## 2014-01-31 ENCOUNTER — Ambulatory Visit: Payer: BC Managed Care – PPO

## 2014-01-31 ENCOUNTER — Other Ambulatory Visit (HOSPITAL_BASED_OUTPATIENT_CLINIC_OR_DEPARTMENT_OTHER): Payer: BC Managed Care – PPO

## 2014-01-31 ENCOUNTER — Ambulatory Visit
Admission: RE | Admit: 2014-01-31 | Discharge: 2014-01-31 | Disposition: A | Discharge status: Home / Self Care | Payer: BC Managed Care – PPO | Source: Ambulatory Visit | Attending: Oncology | Admitting: Oncology

## 2014-01-31 ENCOUNTER — Telehealth: Payer: Self-pay | Admitting: Internal Medicine

## 2014-01-31 DIAGNOSIS — Z1231 Encounter for screening mammogram for malignant neoplasm of breast: Secondary | ICD-10-CM

## 2014-01-31 DIAGNOSIS — C50912 Malignant neoplasm of unspecified site of left female breast: Secondary | ICD-10-CM

## 2014-01-31 DIAGNOSIS — Z853 Personal history of malignant neoplasm of breast: Secondary | ICD-10-CM

## 2014-01-31 LAB — COMPREHENSIVE METABOLIC PANEL (CC13)
ALBUMIN: 3.9 g/dL (ref 3.5–5.0)
ALT: 17 U/L (ref 0–55)
AST: 15 U/L (ref 5–34)
Alkaline Phosphatase: 66 U/L (ref 40–150)
Anion Gap: 10 mEq/L (ref 3–11)
BUN: 13.5 mg/dL (ref 7.0–26.0)
CO2: 26 mEq/L (ref 22–29)
Calcium: 10.2 mg/dL (ref 8.4–10.4)
Chloride: 104 mEq/L (ref 98–109)
Creatinine: 0.9 mg/dL (ref 0.6–1.1)
GLUCOSE: 104 mg/dL (ref 70–140)
POTASSIUM: 4.3 meq/L (ref 3.5–5.1)
SODIUM: 140 meq/L (ref 136–145)
Total Bilirubin: 0.2 mg/dL (ref 0.20–1.20)
Total Protein: 7.7 g/dL (ref 6.4–8.3)

## 2014-01-31 LAB — CBC WITH DIFFERENTIAL/PLATELET
BASO%: 0.7 % (ref 0.0–2.0)
Basophils Absolute: 0 10*3/uL (ref 0.0–0.1)
EOS%: 2.3 % (ref 0.0–7.0)
Eosinophils Absolute: 0.1 10*3/uL (ref 0.0–0.5)
HEMATOCRIT: 39.7 % (ref 34.8–46.6)
HGB: 13.1 g/dL (ref 11.6–15.9)
LYMPH#: 2.4 10*3/uL (ref 0.9–3.3)
LYMPH%: 41.7 % (ref 14.0–49.7)
MCH: 31.7 pg (ref 25.1–34.0)
MCHC: 33 g/dL (ref 31.5–36.0)
MCV: 96 fL (ref 79.5–101.0)
MONO#: 0.5 10*3/uL (ref 0.1–0.9)
MONO%: 8 % (ref 0.0–14.0)
NEUT#: 2.7 10*3/uL (ref 1.5–6.5)
NEUT%: 47.3 % (ref 38.4–76.8)
Platelets: 253 10*3/uL (ref 145–400)
RBC: 4.14 10*6/uL (ref 3.70–5.45)
RDW: 14.3 % (ref 11.2–14.5)
WBC: 5.8 10*3/uL (ref 3.9–10.3)

## 2014-01-31 NOTE — Telephone Encounter (Signed)
Gave pt appt for MD only, pt is late today and has to r/s MD appt, 1st opening is 02/25/14

## 2014-02-25 ENCOUNTER — Ambulatory Visit (HOSPITAL_BASED_OUTPATIENT_CLINIC_OR_DEPARTMENT_OTHER): Payer: BC Managed Care – PPO | Admitting: Internal Medicine

## 2014-02-25 VITALS — BP 161/79 | HR 70 | Temp 97.1°F | Resp 18 | Ht 64.0 in | Wt 186.1 lb

## 2014-02-25 DIAGNOSIS — Z853 Personal history of malignant neoplasm of breast: Secondary | ICD-10-CM

## 2014-02-25 DIAGNOSIS — C50919 Malignant neoplasm of unspecified site of unspecified female breast: Secondary | ICD-10-CM

## 2014-02-25 DIAGNOSIS — Z1231 Encounter for screening mammogram for malignant neoplasm of breast: Secondary | ICD-10-CM

## 2014-02-25 DIAGNOSIS — I10 Essential (primary) hypertension: Secondary | ICD-10-CM

## 2014-02-25 DIAGNOSIS — M5090 Cervical disc disorder, unspecified, unspecified cervical region: Secondary | ICD-10-CM

## 2014-02-25 NOTE — Progress Notes (Signed)
[facility] OFFICE PROGRESS NOTE  No PCP Per Patient No address on file  DIAGNOSIS: Breast cancer  Other screening mammogram  Chief Complaint  Patient presents with  . Follow-up    CURRENT TREATMENT: Observation.  INTERVAL HISTORY: Diane Franco 58 y.o. female with a history of history of left breast cancer (2002)is here for follow up. Oncologic History is of T1N1 left breast cancer with micrometastatic disease in 1 or 6 axillary nodes in February 2002, patient premenopausal then. The cancer was ER/PR + and Her2 negative. She had mastectomy with 6 node axillary evaluation, adjuvant adriamycin/cytoxan/taxol followed by 5 years of tamoxifen thru Dec 2007. She has been on observation since then .She had hysterectomy without oophorectomy in March 2009 for uterine prolapse. She was last seen by Dr. Marko Plume on 02/05/2013.  She follows with Urgent care annually.  She denies any weight changes.  She had a colonoscopy in 2009 with 3 polyps removed.  She denies any recent hospitalizations or emergency room visits.  She works as a Scientist, forensic.  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 Otherwise, she reports doing well.   MEDICAL HISTORY: Past Medical History  Diagnosis Date  . Cataract   . Hypertension     INTERIM HISTORY: has Breast cancer; Other screening mammogram; and Malignant hypertension on her problem list.    ALLERGIES:  is allergic to codeine.  MEDICATIONS: has a current medication list which includes the following prescription(s): clonazepam and lisinopril-hydrochlorothiazide.  SURGICAL HISTORY:  Past Surgical History  Procedure Laterality Date  . Breast surgery      REVIEW OF SYSTEMS:   Constitutional: Denies fevers, chills or abnormal weight loss Eyes: Denies blurriness of vision Ears, nose, mouth, throat, and face: Denies mucositis or sore throat Respiratory: Denies cough, dyspnea or wheezes Cardiovascular: Denies palpitation, chest  discomfort or lower extremity swelling Gastrointestinal:  Denies nausea, heartburn or change in bowel habits Skin: Denies abnormal skin rashes Lymphatics: Denies new lymphadenopathy or easy bruising Neurological:Denies numbness, tingling or new weaknesses Behavioral/Psych: Mood is stable, no new changes  All other systems were reviewed with the patient and are negative.  PHYSICAL EXAMINATION: ECOG PERFORMANCE STATUS: 0 - Asymptomatic  Blood pressure 161/79, pulse 70, temperature 97.1 F (36.2 C), temperature source Oral, resp. rate 18, height _0  (1.626 m), weight 186 lb 1.6 oz (84.414 kg).  GENERAL:alert, no distress and comfortable; well developed and well nourished.  SKIN: skin color, texture, turgor are normal, no rashes or significant lesions EYES: normal, Conjunctiva are pink and non-injected, sclera clear OROPHARYNX:no exudate, no erythema and lips, buccal mucosa, and tongue normal  NECK: supple, thyroid normal size, non-tender, without nodularity LYMPH:  no palpable lymphadenopathy in the cervical, axillary or supraclavicular Breast:deferred at patient's request.  LUNGS: clear to auscultation with normal breathing effort, no wheezes or rhonchi HEART: regular rate & rhythm and no murmurs and no lower extremity edema ABDOMEN:abdomen soft, non-tender and normal bowel sounds Musculoskeletal:no cyanosis of digits and no clubbing  NEURO: alert & oriented x 3 with fluent speech, no focal motor/sensory deficits  Labs:  Lab Results  Component Value Date   WBC 5.8 01/31/2014   HGB 13.1 01/31/2014   HCT 39.7 01/31/2014   MCV 96.0 01/31/2014   PLT 253 01/31/2014   NEUTROABS 2.7 01/31/2014      Chemistry      Component Value Date/Time   NA 140 01/31/2014 1128   NA 138 01/31/2013 1711   K 4.3 01/31/2014 1128   K  4.1 01/31/2013 1711   CL 103 01/31/2013 1711   CL 104 09/03/2012 1525   CO2 26 01/31/2014 1128   CO2 27 01/31/2013 1711   BUN 13.5 01/31/2014 1128   BUN 22 01/31/2013 1711   CREATININE 0.9  01/31/2014 1128   CREATININE 1.26* 01/31/2013 1711   CREATININE 0.93 07/25/2011 1416      Component Value Date/Time   CALCIUM 10.2 01/31/2014 1128   CALCIUM 9.8 01/31/2013 1711   ALKPHOS 66 01/31/2014 1128   ALKPHOS 63 07/25/2011 1416   AST 15 01/31/2014 1128   AST 15 07/25/2011 1416   ALT 17 01/31/2014 1128   ALT 14 07/25/2011 1416   BILITOT 0.20 01/31/2014 1128   BILITOT 0.2* 07/25/2011 1416       RADIOGRAPHIC STUDIES: Mm Digital Screening Unilat R  01/31/2014   CLINICAL DATA:  Screening.  EXAM: DIGITAL SCREENING UNILATERAL RIGHT MAMMOGRAM WITH CAD  COMPARISON:  Previous exam(s).  ACR Breast Density Category c: The breast tissue is heterogeneously dense, which may obscure small masses.  FINDINGS: There are no findings suspicious for malignancy. Images were processed with CAD.  IMPRESSION: No mammographic evidence of malignancy. A result letter of this screening mammogram will be mailed directly to the patient.  RECOMMENDATION: Screening mammogram in one year. (Code:SM-B-01Y)  BI-RADS CATEGORY  1: Negative.   Electronically Signed   By: Curlene Dolphin M.D.   On: 01/31/2014 13:13    ASSESSMENT: Diane Franco 58 y.o. female with a history of Breast cancer  Other screening mammogram  PLAN:  1.left breast cancer 2002 when premenopausal: ER PR +, HER 2 -, post treatment as above and now on observation. I will see her back in a year or sooner if needed. If doing well then, and as she is now established with PCP, could probably change follow up at this office to prn then. Mammograms yearly. Blood counts yearly due to previous chemo.  2.cervical disc disease causing neck and shoulder pain. Surgery by Dr Arnoldo Morale was planned but patient declined surgery.   3.HTN on medication including diuretic. Creatinine is improving.  4.post hysterectomy without oophorectomy: still need pelvic exams yearly with ovaries in and breast cancer history. 5. Screening exams.  Colonoscopy follow up.    All questions were  answered. The patient knows to call the clinic with any problems, questions or concerns. We can certainly see the patient much sooner if necessary.  I spent 15 minutes counseling the patient face to face. The total time spent in the appointment was 25 minutes.    Concha Norway, MD 02/25/2014 4:02 PM

## 2014-02-26 ENCOUNTER — Telehealth: Payer: Self-pay | Admitting: Internal Medicine

## 2014-02-26 NOTE — Telephone Encounter (Signed)
per 5/26 pof f/u as needed.

## 2014-04-29 IMAGING — CR DG ABDOMEN 2V
2 series · 2 of 2 positions shown · non-contrast
Comparison: CT of the abdomen pelvis of 08/06/2004

CLINICAL DATA: Abdominal pain

ABDOMEN - 2 VIEW

[AP (1 of 2)]
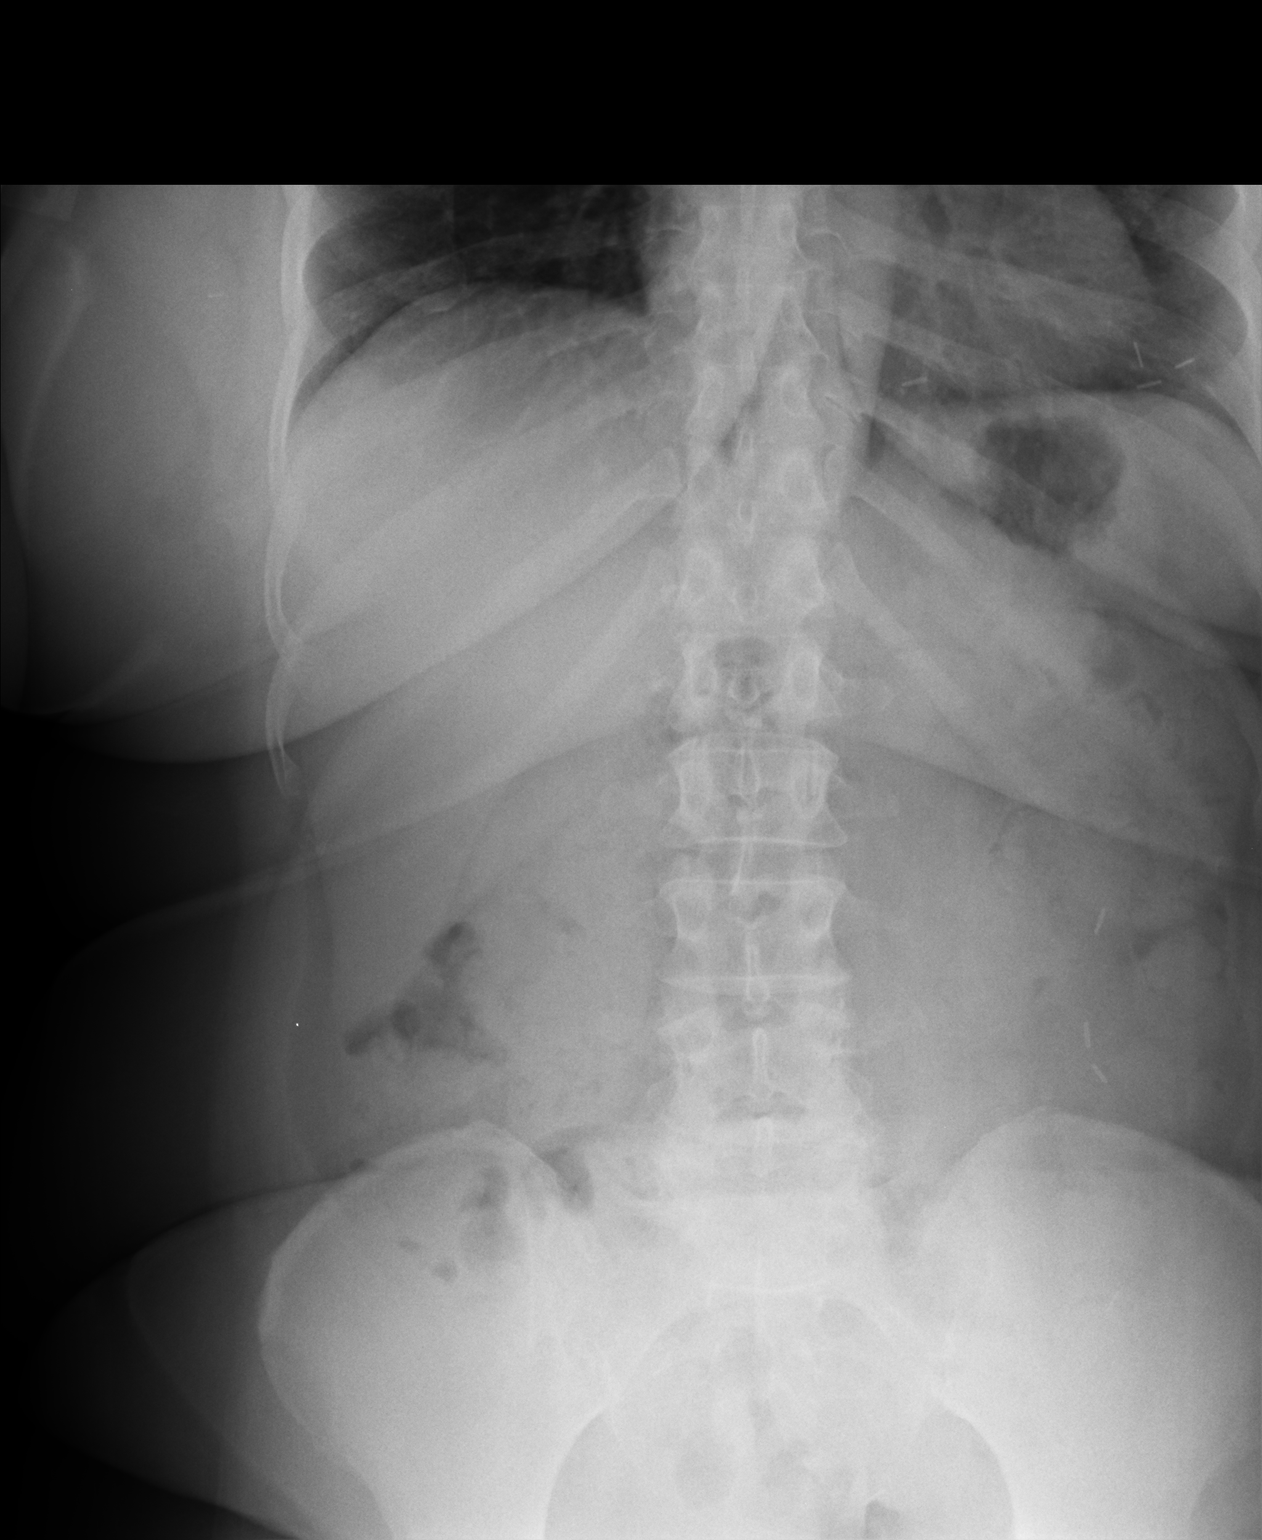

[AP (2 of 2)]
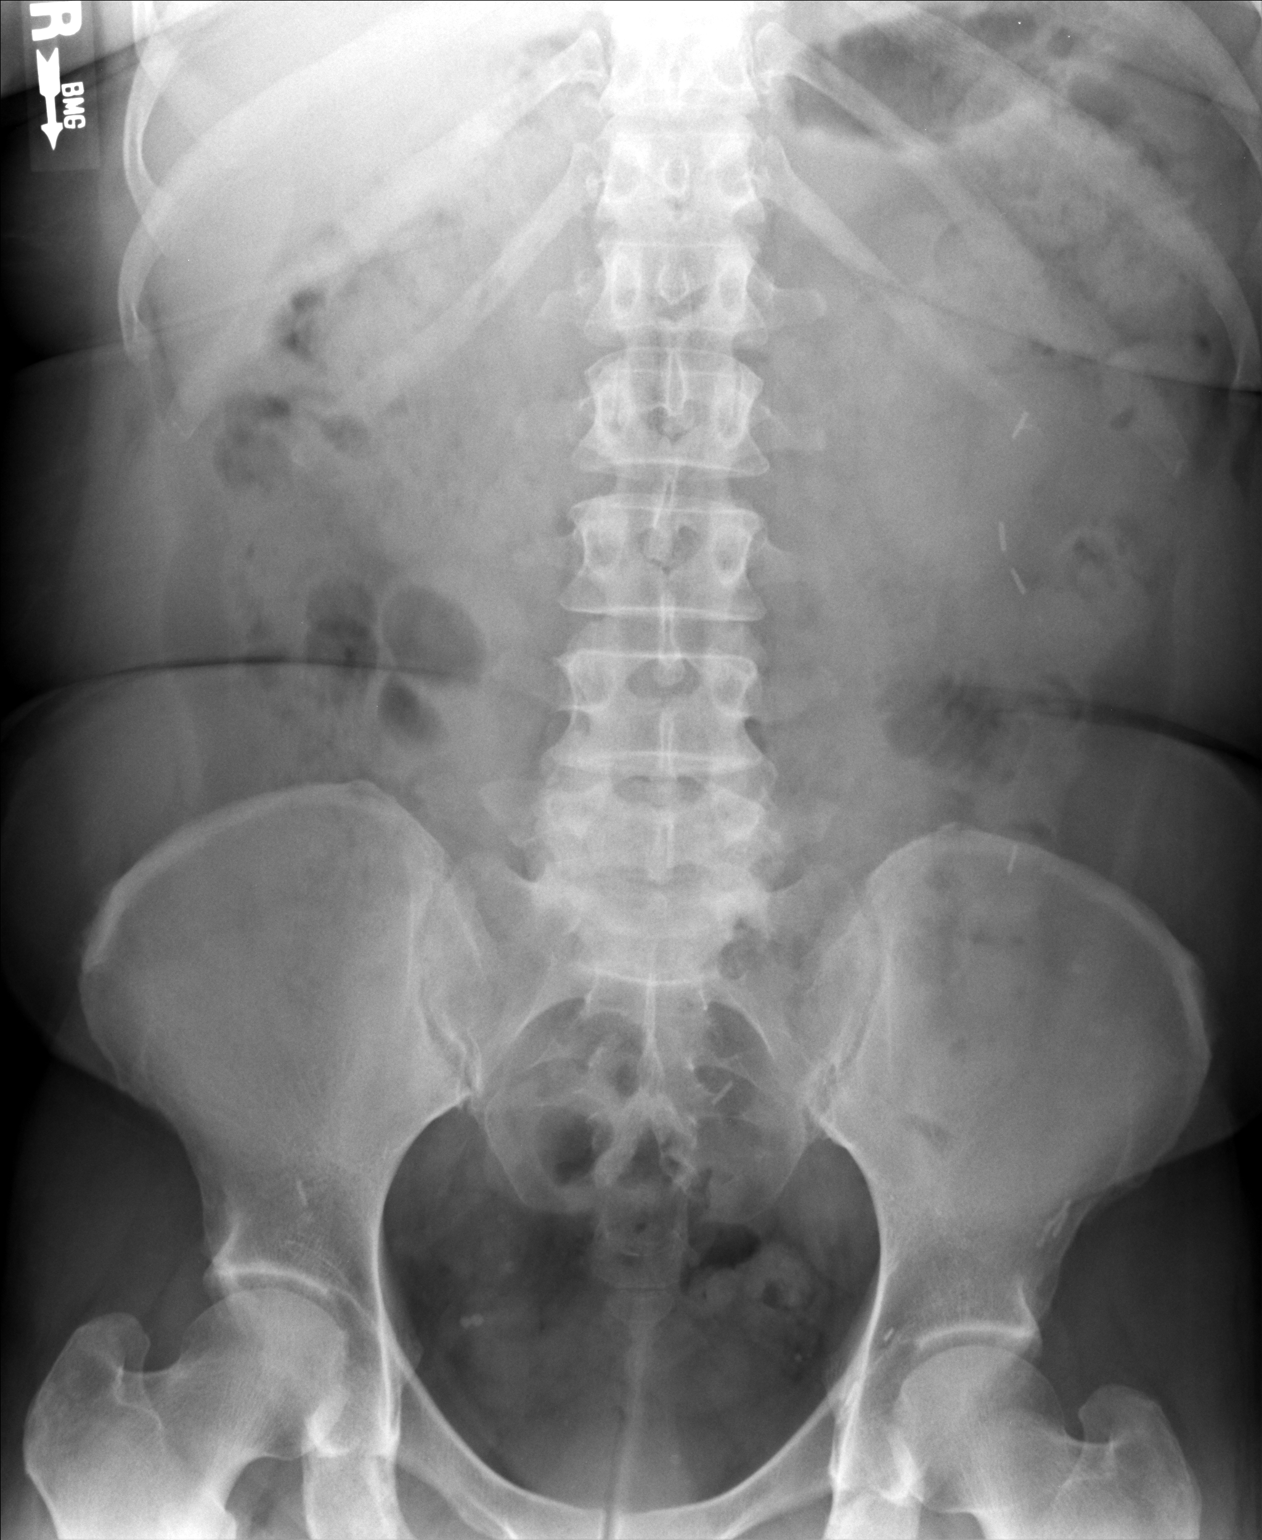

[2 of 2 positions shown; findings below may reference images not displayed]

FINDINGS: Supine and erect views of the abdomen show no bowel
obstruction.  No free air is seen on the erect view.  Surgical
clips overlie the left abdomen and right lower quadrant.  No opaque
calculi are seen.  No bony abnormality is noted.
IMPRESSION: No bowel obstruction.  No free air.

## 2014-04-29 IMAGING — CR DG CHEST 2V
2 series · 2 of 2 positions shown · non-contrast
Comparison: None.

CLINICAL DATA: Shortness of breath.

CHEST - 2 VIEW

[PA]
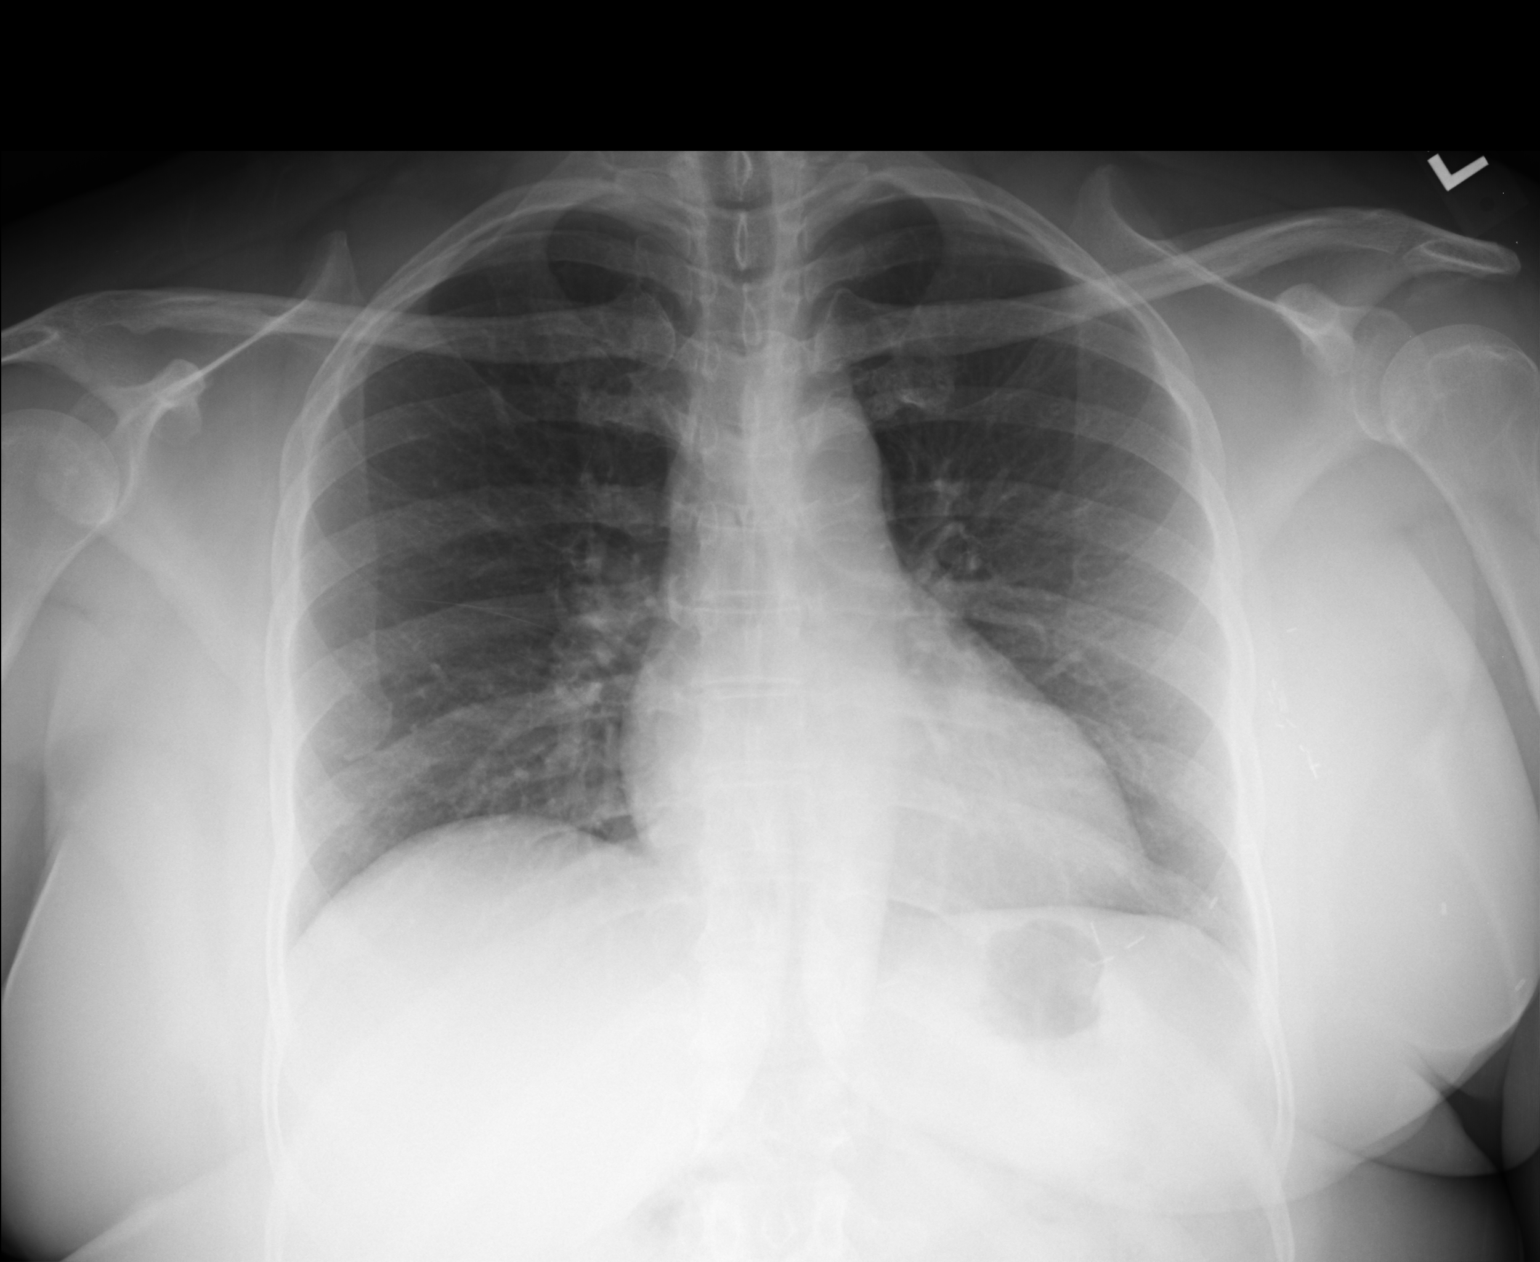

[lateral]
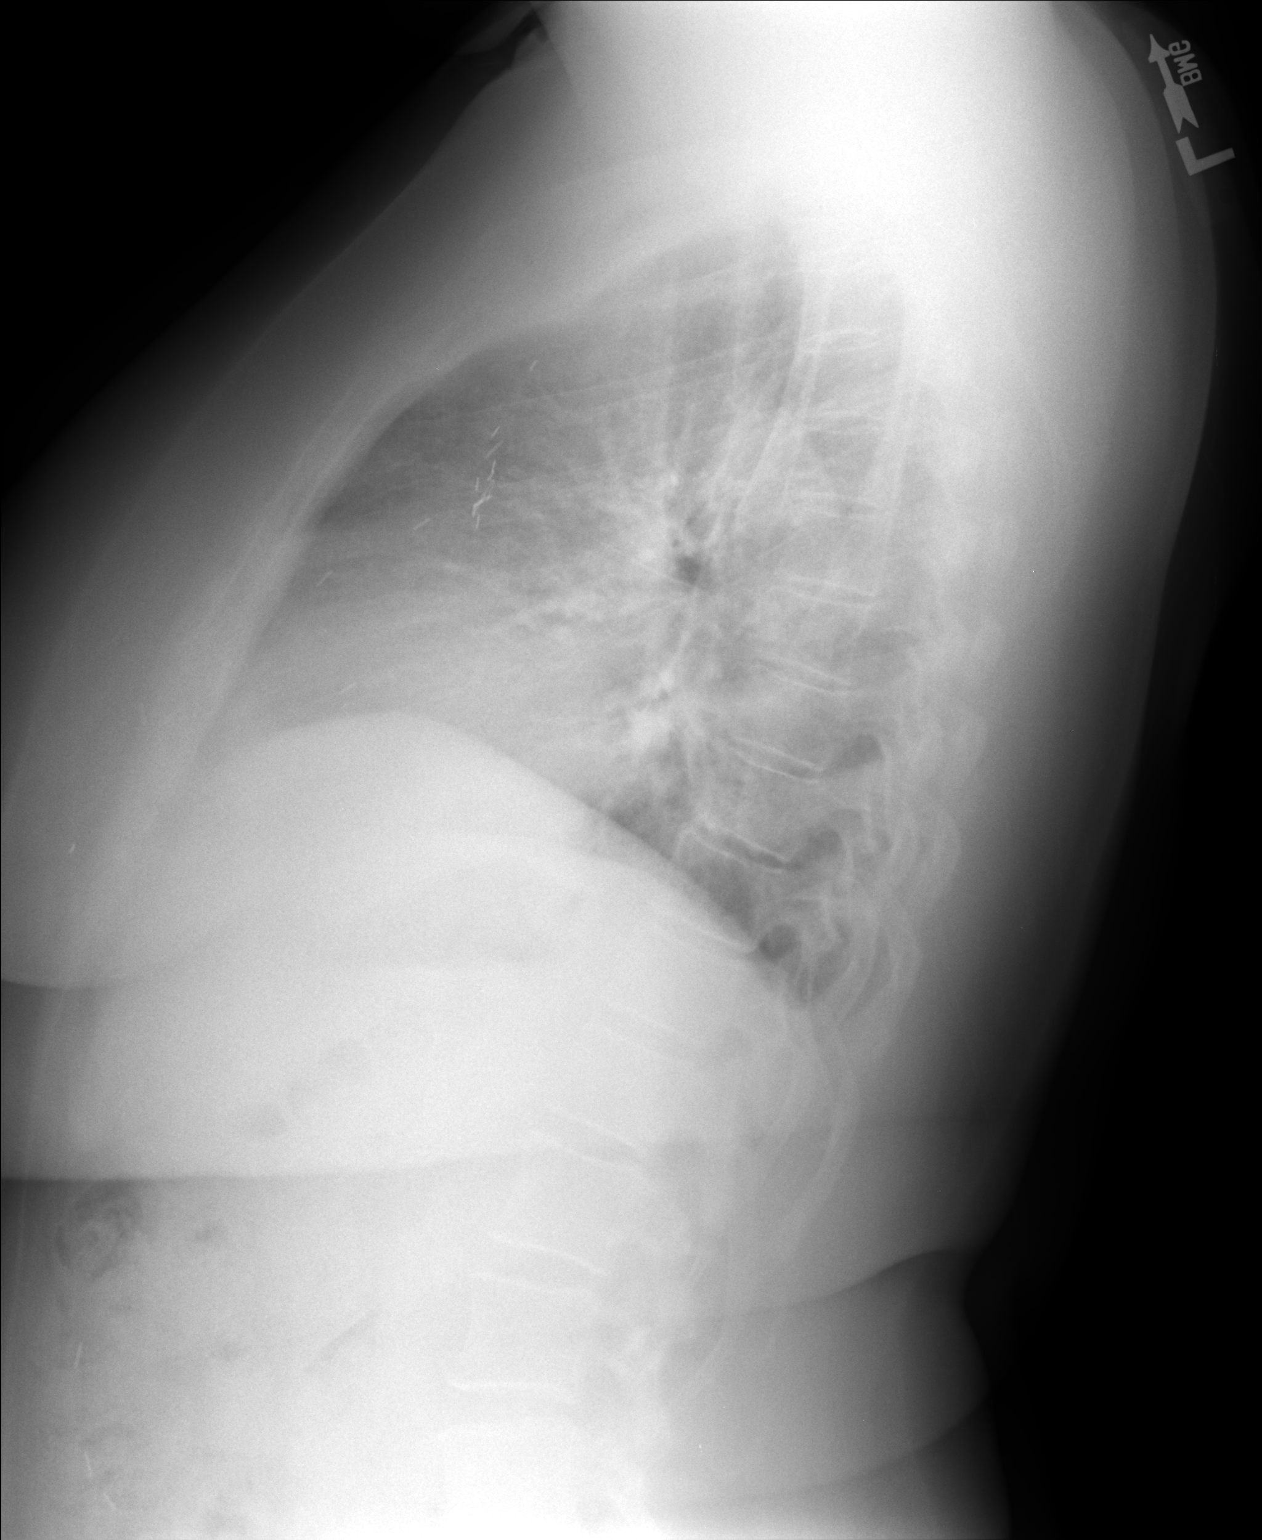

[2 of 2 positions shown; findings below may reference images not displayed]

FINDINGS: The lungs are clear.  Heart size is normal.  No
pneumothorax or pleural fluid.  Surgical clips in the left breast
and axilla are noted.  No focal bony abnormality.
IMPRESSION: No acute disease.

## 2014-06-16 ENCOUNTER — Other Ambulatory Visit: Payer: Self-pay | Admitting: Family Medicine

## 2014-07-24 ENCOUNTER — Other Ambulatory Visit: Payer: Self-pay | Admitting: Physician Assistant

## 2014-07-24 NOTE — Telephone Encounter (Signed)
NEEDS OFFICE VISIT.

## 2016-03-08 ENCOUNTER — Other Ambulatory Visit: Payer: Self-pay | Admitting: Family Medicine

## 2016-03-08 DIAGNOSIS — Z1231 Encounter for screening mammogram for malignant neoplasm of breast: Secondary | ICD-10-CM

## 2016-03-24 ENCOUNTER — Ambulatory Visit
Admission: RE | Admit: 2016-03-24 | Discharge: 2016-03-24 | Disposition: A | Discharge status: Home / Self Care | Payer: BLUE CROSS/BLUE SHIELD | Source: Ambulatory Visit | Attending: Family Medicine | Admitting: Family Medicine

## 2016-03-24 DIAGNOSIS — Z1231 Encounter for screening mammogram for malignant neoplasm of breast: Secondary | ICD-10-CM

## 2016-06-23 ENCOUNTER — Ambulatory Visit (INDEPENDENT_AMBULATORY_CARE_PROVIDER_SITE_OTHER): Payer: BLUE CROSS/BLUE SHIELD | Admitting: Physician Assistant

## 2016-06-23 ENCOUNTER — Encounter: Payer: Self-pay | Admitting: Physician Assistant

## 2016-06-23 ENCOUNTER — Ambulatory Visit (INDEPENDENT_AMBULATORY_CARE_PROVIDER_SITE_OTHER): Payer: BLUE CROSS/BLUE SHIELD

## 2016-06-23 VITALS — BP 158/88 | HR 92 | Temp 98.3°F | Resp 16 | Ht 64.0 in | Wt 186.0 lb

## 2016-06-23 DIAGNOSIS — M25511 Pain in right shoulder: Secondary | ICD-10-CM

## 2016-06-23 MED ORDER — MELOXICAM 15 MG PO TABS: 15.0000 mg | ORAL_TABLET | Freq: Every day | ORAL | 1 refills | Status: AC

## 2016-06-23 NOTE — Progress Notes (Signed)
Chantelle Pillado  MRN: [id number] DOB: [birth year]  Subjective:  Diane Franco is a 60 y.o. female seen in office today for a chief complaint of right arm pain x 1 day. She was outside with grandchild yesterday when she tripped over a weedeater and fell on outreached right arm. Pt notes the pain is most signifcant in her shoulder. She cannot completely raise her right arm. Has associated swelling and intermittent shooting, dull ache pain in right arm.  Denies decreased grip strenghth, forearm or elbow pian, numbness, tingling,and  loss of sensation. Has tried tylenol and ice with no relief.  Review of Systems  Musculoskeletal: Negative for back pain and neck pain.  Neurological: Negative for dizziness, numbness and headaches.    Patient Active Problem List   Diagnosis Date Noted  . Malignant hypertension 01/07/2013  . Breast cancer ([place]) 09/03/2012  . Other screening mammogram 09/03/2012    Current Outpatient Prescriptions on File Prior to Visit  Medication Sig Dispense Refill  . lisinopril-hydrochlorothiazide (PRINZIDE,ZESTORETIC) 20-25 MG per tablet Take 1 tablet by mouth daily. NEEDS OFFICE VISIT 15 tablet 0   No current facility-administered medications on file prior to visit.     Allergies  Allergen Reactions  . Codeine Other (See Comments)    dizziness    Objective:  BP (!) 158/88 (BP Location: Right Arm, Patient Position: Sitting, Cuff Size: Large)   Pulse 92   Temp 98.3 F (36.8 C) (Oral)   Resp 16   Ht 5\' 4"  (1.626 m)   Wt 186 lb (84.4 kg)   SpO2 97%   BMI 31.93 kg/m   Physical Exam  Constitutional: She is oriented to person, place, and time and well-developed, well-nourished, and in no distress.  HENT:  Head: Normocephalic and atraumatic.  Eyes: Conjunctivae are normal.  Neck: Normal range of motion.  Pulmonary/Chest: Effort normal.  Musculoskeletal:       Right shoulder: She exhibits tenderness (most notably upon palpation of  AC joint,  minimal tenderness at coracoacromial joint  ), swelling (minimal noted over [facility] joint region ) and pain (with attempting abduction ). She exhibits no deformity. Decreased range of motion: pt unable to fully abduct shoulder due to pain        Left shoulder: Normal.       Right elbow: Normal.      Right upper arm: Normal.  Right shoulder exam limited due to patient's pain.   Neurological: She is alert and oriented to person, place, and time. Gait normal.  Skin: Skin is warm and dry.  Psychiatric: Affect normal.  Vitals reviewed.   Dg Shoulder Right  Result Date: 06/23/2016 CLINICAL DATA:  Fall yesterday, right shoulder pain EXAM: RIGHT SHOULDER - 2+ VIEW COMPARISON:  None. FINDINGS: Three views of the right shoulder submitted. No acute fracture is noted. There is mild elevation of distal right clavicle from [facility] joint suspicious for superior subluxation. There is spurring of humeral head. IMPRESSION: No acute fracture. Question mild subluxation of AC joint. Spurring of humeral head. Electronically Signed   By: Lahoma Crocker M.D.   On: 06/23/2016 12:48    Assessment and Plan :  1. Shoulder pain, acute, right -Suspect Type I-II AC joint injury, will treat with R.I.C.E. therapy and sling for immobilization - DG Shoulder Right; Future - meloxicam (MOBIC) 15 MG tablet; Take 1 tablet (15 mg total) by mouth daily.  Dispense: 30 tablet; Refill: 1 -Follow up in 4 days for days for reevaluation   Tenna Delaine PA-C  [facility] 06/23/2016 1:20 PM

## 2016-06-23 NOTE — Patient Instructions (Addendum)
  Return in 4 days for follow up exam Wear shoulder sling daily Use meloxicam daily and ice to affected area 4-5 x day   IF you received an x-ray today, you will receive an invoice from [facility]. Please contact [facility] at [phone] with questions or concerns regarding your invoice.   IF you received labwork today, you will receive an invoice from [employer]. Please contact [facility] at [phone] with questions or concerns regarding your invoice.   Our billing staff will not be able to assist you with questions regarding bills from these companies.  You will be contacted with the lab results as soon as they are available. The fastest way to get your results is to activate your My Chart account. Instructions are located on the last page of this paperwork. If you have not heard from [place] regarding the results in 2 weeks, please contact this office.

## 2017-08-29 ENCOUNTER — Ambulatory Visit
Admission: RE | Admit: 2017-08-29 | Discharge: 2017-08-29 | Disposition: A | Discharge status: Home / Self Care | Payer: BLUE CROSS/BLUE SHIELD | Source: Ambulatory Visit | Attending: Family Medicine | Admitting: Family Medicine

## 2017-08-29 ENCOUNTER — Other Ambulatory Visit: Payer: Self-pay | Admitting: Family Medicine

## 2017-08-29 DIAGNOSIS — Z1231 Encounter for screening mammogram for malignant neoplasm of breast: Secondary | ICD-10-CM

## 2017-08-29 HISTORY — DX: Personal history of antineoplastic chemotherapy: Z92.21

## 2017-10-13 IMAGING — DX DG SHOULDER 2+V*R*
3 series · 4 of 4 positions shown · non-contrast
Comparison: None.

CLINICAL DATA: Fall yesterday, right shoulder pain

EXAM:
RIGHT SHOULDER - 2+ VIEW

[shoulder ap]
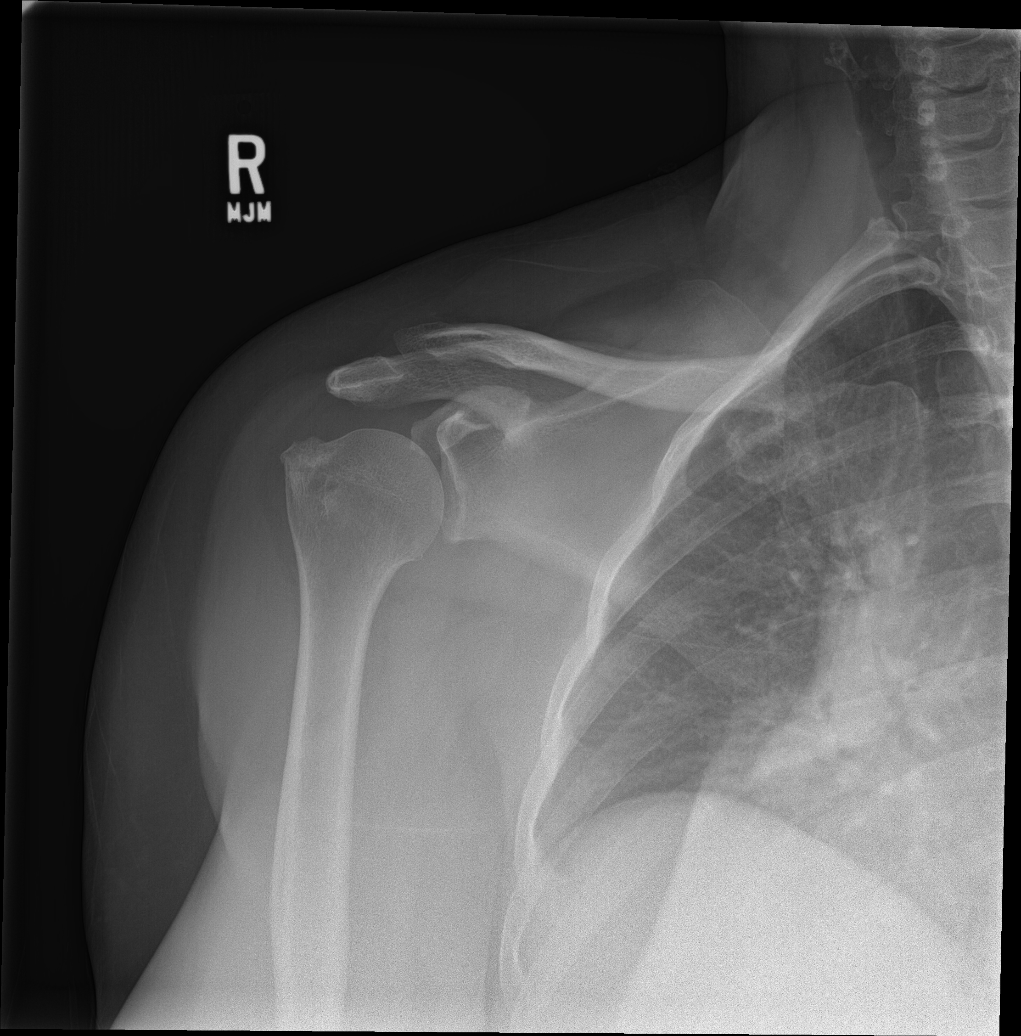

[Series 2: shoulder y-view · 0.14mm/px · 2 of 2 slices shown]
[im 1/2]
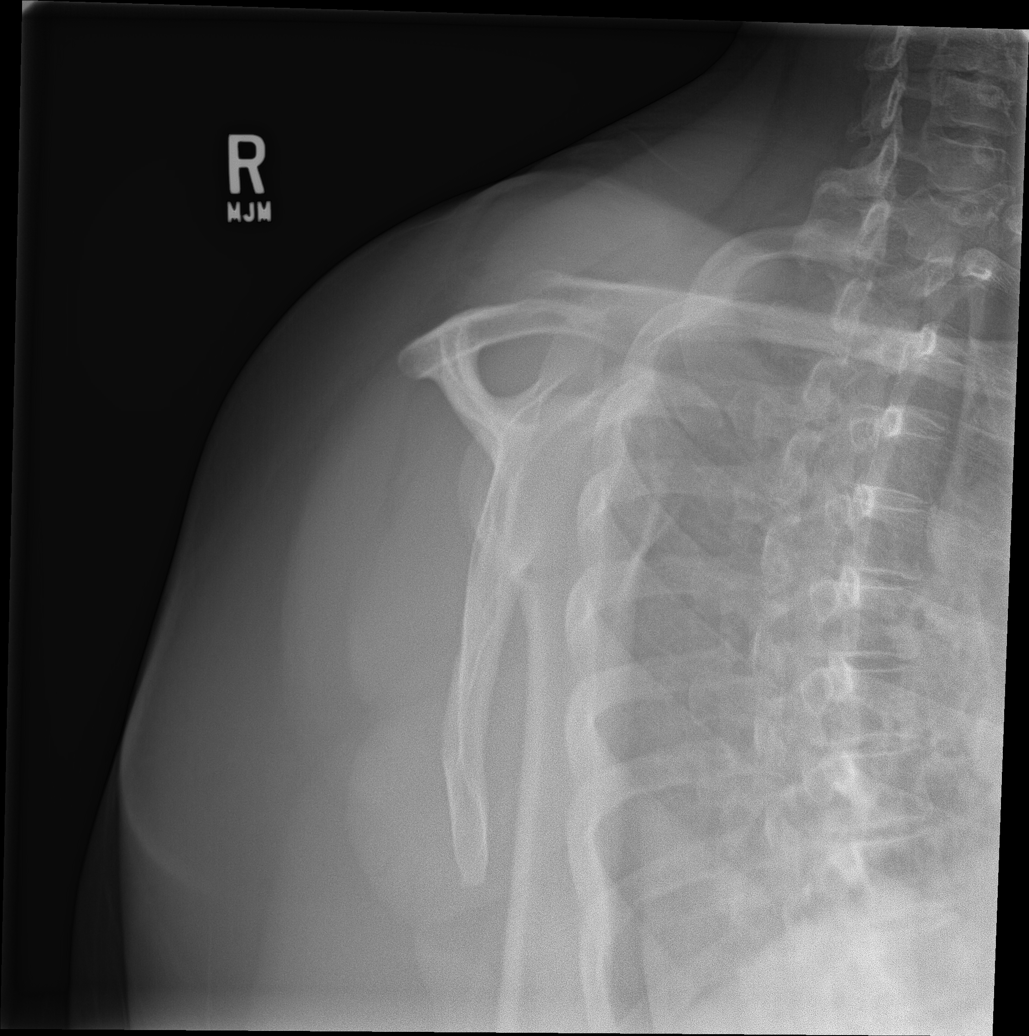
[im 2/2]
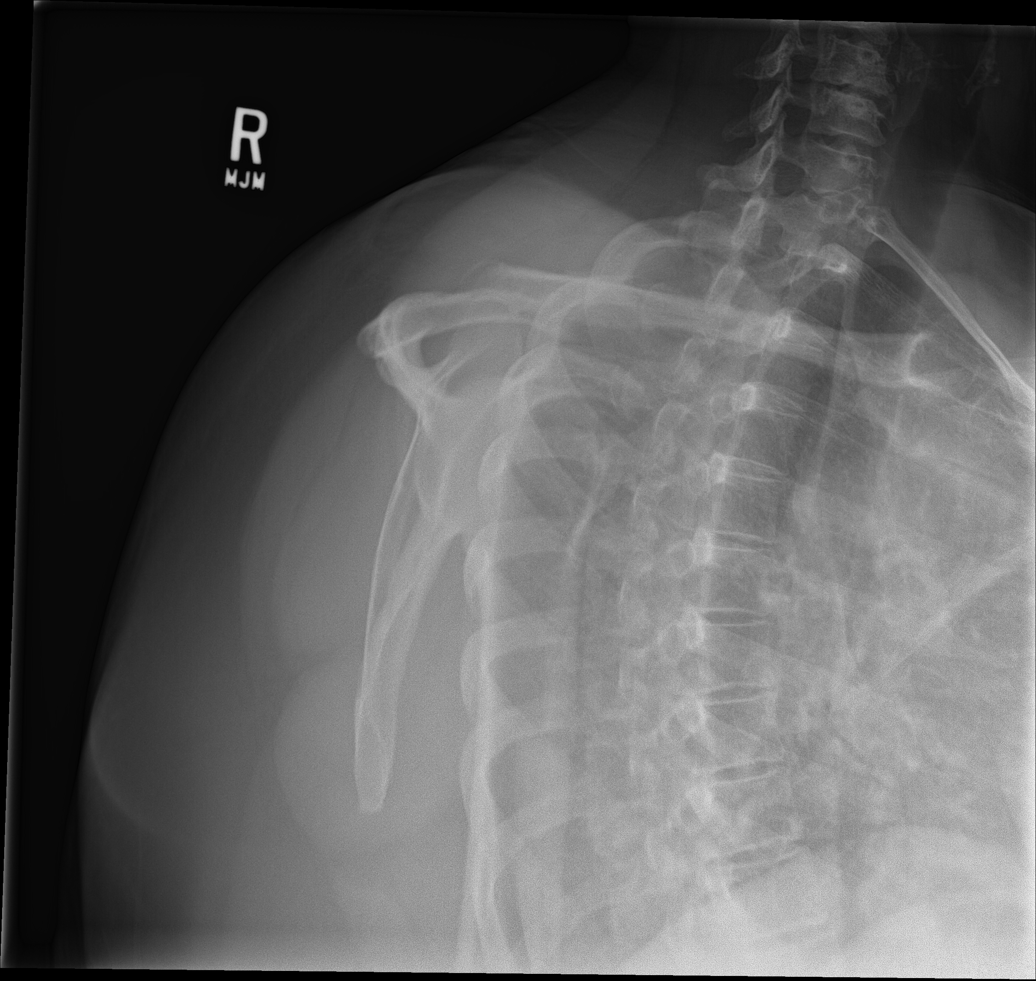

[shoulder axial]
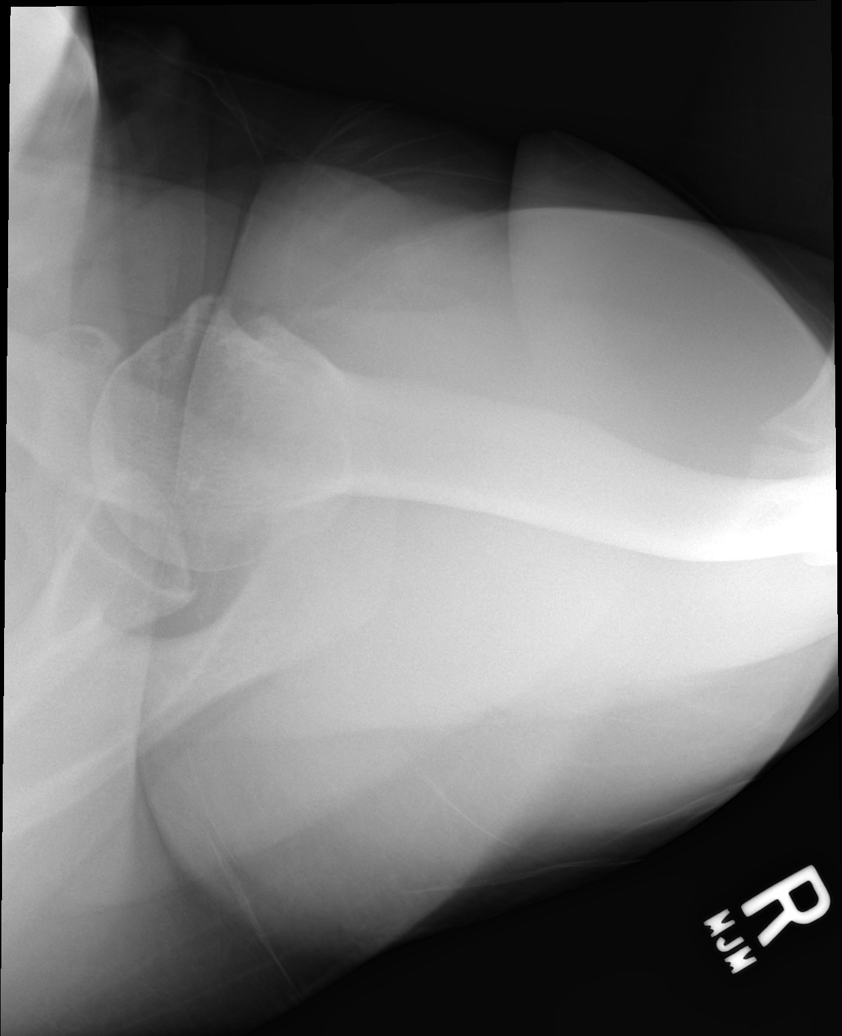

[4 of 4 positions shown; findings below may reference images not displayed]

FINDINGS: Three views of the right shoulder submitted. No acute fracture is
noted. There is mild elevation of distal right clavicle from AC
joint suspicious for superior subluxation. There is spurring of
humeral head.
IMPRESSION: No acute fracture. Question mild subluxation of AC joint. Spurring
of humeral head.

## 2019-09-29 ENCOUNTER — Other Ambulatory Visit: Payer: Self-pay

## 2019-09-29 ENCOUNTER — Emergency Department (HOSPITAL_COMMUNITY): Payer: BC Managed Care – PPO

## 2019-09-29 ENCOUNTER — Emergency Department (HOSPITAL_COMMUNITY)
Admission: EM | Admit: 2019-09-29 | Discharge: 2019-09-29 | Disposition: A | Discharge status: Home / Self Care | Payer: BC Managed Care – PPO | Attending: Emergency Medicine | Admitting: Emergency Medicine

## 2019-09-29 DIAGNOSIS — Z79899 Other long term (current) drug therapy: Secondary | ICD-10-CM | POA: Insufficient documentation

## 2019-09-29 DIAGNOSIS — W3400XA Accidental discharge from unspecified firearms or gun, initial encounter: Secondary | ICD-10-CM | POA: Insufficient documentation

## 2019-09-29 DIAGNOSIS — I10 Essential (primary) hypertension: Secondary | ICD-10-CM

## 2019-09-29 DIAGNOSIS — Y9389 Activity, other specified: Secondary | ICD-10-CM | POA: Insufficient documentation

## 2019-09-29 DIAGNOSIS — Z23 Encounter for immunization: Secondary | ICD-10-CM | POA: Insufficient documentation

## 2019-09-29 DIAGNOSIS — Y998 Other external cause status: Secondary | ICD-10-CM | POA: Insufficient documentation

## 2019-09-29 DIAGNOSIS — S4991XA Unspecified injury of right shoulder and upper arm, initial encounter: Secondary | ICD-10-CM | POA: Diagnosis present

## 2019-09-29 DIAGNOSIS — Y92018 Other place in single-family (private) house as the place of occurrence of the external cause: Secondary | ICD-10-CM | POA: Insufficient documentation

## 2019-09-29 DIAGNOSIS — Z7982 Long term (current) use of aspirin: Secondary | ICD-10-CM | POA: Diagnosis not present

## 2019-09-29 DIAGNOSIS — S41031A Puncture wound without foreign body of right shoulder, initial encounter: Secondary | ICD-10-CM | POA: Diagnosis not present

## 2019-09-29 LAB — CBC
HCT: 41.2 % (ref 36.0–46.0)
Hemoglobin: 13.8 g/dL (ref 12.0–15.0)
MCH: 32.7 pg (ref 26.0–34.0)
MCHC: 33.5 g/dL (ref 30.0–36.0)
MCV: 97.6 fL (ref 80.0–100.0)
Platelets: 145 10*3/uL — ABNORMAL LOW (ref 150–400)
RBC: 4.22 MIL/uL (ref 3.87–5.11)
RDW: 14.3 % (ref 11.5–15.5)
WBC: 12.4 10*3/uL — ABNORMAL HIGH (ref 4.0–10.5)
nRBC: 0.2 % (ref 0.0–0.2)

## 2019-09-29 LAB — COMPREHENSIVE METABOLIC PANEL
ALT: 16 U/L (ref 0–44)
AST: 22 U/L (ref 15–41)
Albumin: 3.9 g/dL (ref 3.5–5.0)
Alkaline Phosphatase: 64 U/L (ref 38–126)
Anion gap: 11 (ref 5–15)
BUN: 17 mg/dL (ref 8–23)
CO2: 23 mmol/L (ref 22–32)
Calcium: 9.6 mg/dL (ref 8.9–10.3)
Chloride: 105 mmol/L (ref 98–111)
Creatinine, Ser: 0.96 mg/dL (ref 0.44–1.00)
GFR calc Af Amer: >60 mL/min (ref >60)
GFR calc non Af Amer: >60 mL/min (ref >60)
Glucose, Bld: 175 mg/dL — ABNORMAL HIGH (ref 70–99)
Potassium: 4.3 mmol/L (ref 3.5–5.1)
Sodium: 139 mmol/L (ref 135–145)
Total Bilirubin: 0.5 mg/dL (ref 0.3–1.2)
Total Protein: 6.8 g/dL (ref 6.5–8.1)

## 2019-09-29 LAB — SAMPLE TO BLOOD BANK

## 2019-09-29 LAB — I-STAT CHEM 8, ED
BUN: 24 mg/dL — ABNORMAL HIGH (ref 8–23)
Calcium, Ion: 1.2 mmol/L (ref 1.15–1.40)
Chloride: 104 mmol/L (ref 98–111)
Creatinine, Ser: 0.9 mg/dL (ref 0.44–1.00)
Glucose, Bld: 173 mg/dL — ABNORMAL HIGH (ref 70–99)
HCT: 42 % (ref 36.0–46.0)
Hemoglobin: 14.3 g/dL (ref 12.0–15.0)
Potassium: 4.2 mmol/L (ref 3.5–5.1)
Sodium: 138 mmol/L (ref 135–145)
TCO2: 29 mmol/L (ref 22–32)

## 2019-09-29 LAB — PROTIME-INR
INR: 0.9 (ref 0.8–1.2)
Prothrombin Time: 12.1 seconds (ref 11.4–15.2)

## 2019-09-29 LAB — ETHANOL: Alcohol, Ethyl (B): <10 mg/dL (ref <10)

## 2019-09-29 MED ORDER — FENTANYL CITRATE (PF) 100 MCG/2ML IJ SOLN
INTRAMUSCULAR | Status: AC
  Filled 2019-09-29: qty 2

## 2019-09-29 MED ORDER — OXYCODONE-ACETAMINOPHEN 5-325 MG PO TABS
2.0000 | ORAL_TABLET | Freq: Once | ORAL | Status: AC
  Administered 2019-09-29: 19:00:00 2 via ORAL
  Filled 2019-09-29: qty 2

## 2019-09-29 MED ORDER — AMLODIPINE BESYLATE 5 MG PO TABS: 5.0000 mg | ORAL_TABLET | Freq: Every day | ORAL | 0 refills | Status: AC

## 2019-09-29 MED ORDER — CEFAZOLIN SODIUM-DEXTROSE 2-4 GM/100ML-% IV SOLN
2.0000 g | Freq: Once | INTRAVENOUS | Status: AC
  Administered 2019-09-29: 2 g via INTRAVENOUS

## 2019-09-29 MED ORDER — OXYCODONE-ACETAMINOPHEN 5-325 MG PO TABS: 1.0000 | ORAL_TABLET | ORAL | 0 refills | Status: AC | PRN

## 2019-09-29 MED ORDER — TETANUS-DIPHTH-ACELL PERTUSSIS 5-2.5-18.5 LF-MCG/0.5 IM SUSP
0.5000 mL | Freq: Once | INTRAMUSCULAR | Status: AC
  Administered 2019-09-29: 18:00:00 0.5 mL via INTRAMUSCULAR

## 2019-09-29 MED ORDER — FENTANYL CITRATE (PF) 100 MCG/2ML IJ SOLN
50.0000 ug | Freq: Once | INTRAMUSCULAR | Status: AC
  Administered 2019-09-29: 50 ug via INTRAVENOUS
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
## 2019-09-29 NOTE — ED Notes (Signed)
No CT needed at this time per [facility], trauma MD.

## 2020-05-19 ENCOUNTER — Ambulatory Visit: Payer: Self-pay | Attending: Internal Medicine

## 2020-05-19 DIAGNOSIS — Z23 Encounter for immunization: Secondary | ICD-10-CM

## 2020-05-19 NOTE — Progress Notes (Signed)
   Covid-19 Vaccination Clinic  Name:  Irem Stoneham    MRN: [id number] DOB: 1956-01-16  05/19/2020  Ms. Roland-Turner was observed post Covid-19 immunization for 15 minutes without incident. She was provided with Vaccine Information Sheet and instruction to access the V-Safe system.   Ms. Sheppard Plumber was instructed to call 911 with any severe reactions post vaccine: Marland Kitchen Difficulty breathing  . Swelling of face and throat  . A fast heartbeat  . A bad rash all over body  . Dizziness and weakness   Immunizations Administered    Name Date Dose VIS Date Route   Pfizer COVID-19 Vaccine 05/19/2020 10:10 AM 0.3 mL 11/27/2018 Intramuscular   Manufacturer: [place]   Lot: [id number]   [place]: [address]-1

## 2020-06-09 ENCOUNTER — Ambulatory Visit: Payer: Self-pay | Attending: Internal Medicine

## 2020-06-09 DIAGNOSIS — Z23 Encounter for immunization: Secondary | ICD-10-CM

## 2020-06-09 NOTE — Progress Notes (Signed)
   Covid-19 Vaccination Clinic  Name:  Diane Franco    MRN: [id number] DOB: [birth year]  06/09/2020  Diane Franco was observed post Covid-19 immunization for 15 minutes without incident. She was provided with Vaccine Information Sheet and instruction to access the V-Safe system.   Diane Franco was instructed to call 911 with any severe reactions post vaccine: Marland Kitchen Difficulty breathing  . Swelling of face and throat  . A fast heartbeat  . A bad rash all over body  . Dizziness and weakness   Immunizations Administered    Name Date Dose VIS Date Route   Pfizer COVID-19 Vaccine 06/09/2020 10:20 AM 0.3 mL 11/27/2018 Intramuscular   Manufacturer: [place]   Lot: [id number]   [place]: [id number]

## 2020-09-06 DIAGNOSIS — E782 Mixed hyperlipidemia: Secondary | ICD-10-CM | POA: Diagnosis not present

## 2020-09-06 DIAGNOSIS — R946 Abnormal results of thyroid function studies: Secondary | ICD-10-CM | POA: Diagnosis not present

## 2020-09-06 DIAGNOSIS — Z79899 Other long term (current) drug therapy: Secondary | ICD-10-CM | POA: Diagnosis not present

## 2020-09-06 DIAGNOSIS — Z6833 Body mass index (BMI) 33.0-33.9, adult: Secondary | ICD-10-CM | POA: Diagnosis not present

## 2020-09-06 DIAGNOSIS — I10 Essential (primary) hypertension: Secondary | ICD-10-CM | POA: Diagnosis not present

## 2020-09-06 DIAGNOSIS — Z0000 Encounter for general adult medical examination without abnormal findings: Secondary | ICD-10-CM | POA: Diagnosis not present

## 2020-09-06 DIAGNOSIS — R5383 Other fatigue: Secondary | ICD-10-CM | POA: Diagnosis not present

## 2020-09-06 DIAGNOSIS — Z1159 Encounter for screening for other viral diseases: Secondary | ICD-10-CM | POA: Diagnosis not present

## 2020-09-06 DIAGNOSIS — R7303 Prediabetes: Secondary | ICD-10-CM | POA: Diagnosis not present

## 2020-09-06 DIAGNOSIS — E559 Vitamin D deficiency, unspecified: Secondary | ICD-10-CM | POA: Diagnosis not present

## 2020-09-11 DIAGNOSIS — Z780 Asymptomatic menopausal state: Secondary | ICD-10-CM | POA: Diagnosis not present

## 2020-09-12 DIAGNOSIS — Z86010 Personal history of colonic polyps: Secondary | ICD-10-CM | POA: Diagnosis not present

## 2020-09-12 DIAGNOSIS — Z01818 Encounter for other preprocedural examination: Secondary | ICD-10-CM | POA: Diagnosis not present

## 2020-09-21 ENCOUNTER — Other Ambulatory Visit: Payer: Self-pay | Admitting: Family Medicine

## 2020-09-21 DIAGNOSIS — Z86010 Personal history of colonic polyps: Secondary | ICD-10-CM | POA: Diagnosis not present

## 2020-09-21 DIAGNOSIS — Z9012 Acquired absence of left breast and nipple: Secondary | ICD-10-CM

## 2020-09-21 DIAGNOSIS — K635 Polyp of colon: Secondary | ICD-10-CM | POA: Diagnosis not present

## 2020-09-21 DIAGNOSIS — Z853 Personal history of malignant neoplasm of breast: Secondary | ICD-10-CM

## 2020-09-21 DIAGNOSIS — Z01818 Encounter for other preprocedural examination: Secondary | ICD-10-CM | POA: Diagnosis not present

## 2020-09-21 DIAGNOSIS — Z1211 Encounter for screening for malignant neoplasm of colon: Secondary | ICD-10-CM | POA: Diagnosis not present

## 2020-09-23 ENCOUNTER — Ambulatory Visit
Admission: RE | Admit: 2020-09-23 | Discharge: 2020-09-23 | Disposition: A | Discharge status: Home / Self Care | Payer: PPO | Source: Ambulatory Visit | Attending: Family Medicine | Admitting: Family Medicine

## 2020-09-23 ENCOUNTER — Other Ambulatory Visit: Payer: Self-pay

## 2020-09-23 DIAGNOSIS — Z1231 Encounter for screening mammogram for malignant neoplasm of breast: Secondary | ICD-10-CM | POA: Diagnosis not present

## 2020-09-23 DIAGNOSIS — Z853 Personal history of malignant neoplasm of breast: Secondary | ICD-10-CM

## 2020-09-23 DIAGNOSIS — Z9012 Acquired absence of left breast and nipple: Secondary | ICD-10-CM

## 2020-10-04 DIAGNOSIS — I6523 Occlusion and stenosis of bilateral carotid arteries: Secondary | ICD-10-CM | POA: Diagnosis not present

## 2020-10-04 DIAGNOSIS — I10 Essential (primary) hypertension: Secondary | ICD-10-CM | POA: Diagnosis not present

## 2020-10-04 DIAGNOSIS — Z6833 Body mass index (BMI) 33.0-33.9, adult: Secondary | ICD-10-CM | POA: Diagnosis not present

## 2020-10-04 DIAGNOSIS — E782 Mixed hyperlipidemia: Secondary | ICD-10-CM | POA: Diagnosis not present

## 2020-10-04 DIAGNOSIS — Z0000 Encounter for general adult medical examination without abnormal findings: Secondary | ICD-10-CM | POA: Diagnosis not present

## 2020-10-04 DIAGNOSIS — R7303 Prediabetes: Secondary | ICD-10-CM | POA: Diagnosis not present

## 2020-10-07 DIAGNOSIS — K635 Polyp of colon: Secondary | ICD-10-CM | POA: Diagnosis not present

## 2020-11-06 DIAGNOSIS — I6523 Occlusion and stenosis of bilateral carotid arteries: Secondary | ICD-10-CM | POA: Diagnosis not present

## 2020-11-18 DIAGNOSIS — K648 Other hemorrhoids: Secondary | ICD-10-CM | POA: Diagnosis not present

## 2020-11-18 DIAGNOSIS — D369 Benign neoplasm, unspecified site: Secondary | ICD-10-CM | POA: Diagnosis not present

## 2021-05-26 DIAGNOSIS — H9011 Conductive hearing loss, unilateral, right ear, with unrestricted hearing on the contralateral side: Secondary | ICD-10-CM | POA: Diagnosis not present

## 2021-07-04 DIAGNOSIS — I10 Essential (primary) hypertension: Secondary | ICD-10-CM | POA: Diagnosis not present

## 2021-07-04 DIAGNOSIS — Z79899 Other long term (current) drug therapy: Secondary | ICD-10-CM | POA: Diagnosis not present

## 2021-07-04 DIAGNOSIS — Z1159 Encounter for screening for other viral diseases: Secondary | ICD-10-CM | POA: Diagnosis not present

## 2021-07-04 DIAGNOSIS — R7303 Prediabetes: Secondary | ICD-10-CM | POA: Diagnosis not present

## 2021-07-04 DIAGNOSIS — E559 Vitamin D deficiency, unspecified: Secondary | ICD-10-CM | POA: Diagnosis not present

## 2021-07-04 DIAGNOSIS — E782 Mixed hyperlipidemia: Secondary | ICD-10-CM | POA: Diagnosis not present

## 2021-07-04 DIAGNOSIS — Z6833 Body mass index (BMI) 33.0-33.9, adult: Secondary | ICD-10-CM | POA: Diagnosis not present

## 2021-07-04 DIAGNOSIS — R42 Dizziness and giddiness: Secondary | ICD-10-CM | POA: Diagnosis not present

## 2021-07-13 DIAGNOSIS — R42 Dizziness and giddiness: Secondary | ICD-10-CM | POA: Diagnosis not present

## 2021-08-01 DIAGNOSIS — I10 Essential (primary) hypertension: Secondary | ICD-10-CM | POA: Diagnosis not present

## 2021-08-01 DIAGNOSIS — Z86010 Personal history of colonic polyps: Secondary | ICD-10-CM | POA: Diagnosis not present

## 2021-08-01 DIAGNOSIS — E782 Mixed hyperlipidemia: Secondary | ICD-10-CM | POA: Diagnosis not present

## 2021-08-01 DIAGNOSIS — Z6833 Body mass index (BMI) 33.0-33.9, adult: Secondary | ICD-10-CM | POA: Diagnosis not present

## 2021-08-01 DIAGNOSIS — R42 Dizziness and giddiness: Secondary | ICD-10-CM | POA: Diagnosis not present

## 2021-11-01 ENCOUNTER — Other Ambulatory Visit: Payer: Self-pay | Admitting: Family Medicine

## 2021-11-01 DIAGNOSIS — Z1231 Encounter for screening mammogram for malignant neoplasm of breast: Secondary | ICD-10-CM

## 2021-11-05 ENCOUNTER — Ambulatory Visit: Payer: PPO

## 2021-11-10 ENCOUNTER — Ambulatory Visit: Payer: PPO

## 2022-03-23 ENCOUNTER — Ambulatory Visit
Admission: RE | Admit: 2022-03-23 | Discharge: 2022-03-23 | Disposition: A | Discharge status: Home / Self Care | Payer: PPO | Source: Ambulatory Visit | Attending: Family Medicine | Admitting: Family Medicine

## 2022-03-23 DIAGNOSIS — Z1231 Encounter for screening mammogram for malignant neoplasm of breast: Secondary | ICD-10-CM

## 2022-09-05 ENCOUNTER — Ambulatory Visit (INDEPENDENT_AMBULATORY_CARE_PROVIDER_SITE_OTHER): Payer: PPO | Admitting: Podiatry

## 2022-09-05 ENCOUNTER — Encounter: Payer: Self-pay | Admitting: Podiatry

## 2022-09-05 ENCOUNTER — Ambulatory Visit (INDEPENDENT_AMBULATORY_CARE_PROVIDER_SITE_OTHER): Payer: PPO

## 2022-09-05 DIAGNOSIS — M25572 Pain in left ankle and joints of left foot: Secondary | ICD-10-CM | POA: Diagnosis not present

## 2022-09-05 DIAGNOSIS — M7662 Achilles tendinitis, left leg: Secondary | ICD-10-CM

## 2022-09-05 MED ORDER — DICLOFENAC SODIUM 75 MG PO TBEC: 75.0000 mg | DELAYED_RELEASE_TABLET | Freq: Two times a day (BID) | ORAL | 2 refills | Status: AC

## 2022-09-05 MED ORDER — TRIAMCINOLONE ACETONIDE 10 MG/ML IJ SUSP
10.0000 mg | Freq: Once | INTRAMUSCULAR | Status: AC
  Administered 2022-09-05: 10 mg

## 2022-09-05 NOTE — Patient Instructions (Signed)

## 2022-09-05 NOTE — Progress Notes (Signed)
Subjective:   Patient ID: Diane Franco, female   DOB: 66 y.o.   MRN: [id number]   HPI Patient states he has a lot of pain in the back of the left heel states its been present for 2 months and she does wear boots at work and states that it is just hard for her to wear boots or any kind of shoe that process.  Patient does not smoke this is somewhat limiting for her and she likes to be active   Review of Systems  All other systems reviewed and are negative.       Objective:  Physical Exam Vitals and nursing note reviewed.  Constitutional:      Appearance: She is well-developed.  Pulmonary:     Effort: Pulmonary effort is normal.  Musculoskeletal:        General: Normal range of motion.  Skin:    General: Skin is warm.  Neurological:     Mental Status: She is alert.     Neurovascular status intact muscle strength found to be adequate range of motion found to be adequate.  I did note that there is discomfort in the posterior medial aspect of the left heel at the insertional point of the tendon into the calcaneus with no central or lateral tendon involvement.  Good digital perfusion well oriented and no pathology currently from a muscle strength standpoint     Assessment:  Acute Achilles tendinitis left medial side no central or lateral involvement     Plan:  H&P x-rays reviewed and at this point discussed treatment options.  I did discuss injection explaining risk associated with this and patient is willing to accept this risk wants to pursue this and understands that she needs to be careful with it for the next few days and I do not want her barefoot or not protecting her foot.  Sterile prep injected the medial side 3 mg dexamethasone Kenalog 5 mg Xylocaine keep it away from the central and lateral portion of the tendon  X-rays indicate significant posterior spur formation no other pathology

## 2022-09-06 ENCOUNTER — Other Ambulatory Visit: Payer: Self-pay | Admitting: Podiatry

## 2022-09-06 DIAGNOSIS — M7662 Achilles tendinitis, left leg: Secondary | ICD-10-CM

## 2022-09-19 ENCOUNTER — Ambulatory Visit (INDEPENDENT_AMBULATORY_CARE_PROVIDER_SITE_OTHER): Payer: PPO | Admitting: Podiatry

## 2022-09-19 ENCOUNTER — Encounter: Payer: Self-pay | Admitting: Podiatry

## 2022-09-19 DIAGNOSIS — M7662 Achilles tendinitis, left leg: Secondary | ICD-10-CM

## 2022-09-20 NOTE — Progress Notes (Signed)
Subjective:   Patient ID: Diane Franco, female   DOB: 66 y.o.   MRN: [id number]   HPI Patient states she is improved still has mild pain admits she did not take the oral medicine   ROS      Objective:  Physical Exam  Neuro vas scaler status intact muscle strength adequate range of motion within normal limits with patient's left posterior heel improved slightly tender but doing better than it was prior     Assessment:  Achilles tendinitis left improved but still present     Plan:  Reviewed condition recommended the continuation of conservative care and went ahead today and I did recommend ice therapy heel lift therapy which were dispensed along with stretching exercises reappoint as symptoms indicate

## 2023-05-04 ENCOUNTER — Ambulatory Visit: Payer: PPO | Admitting: Podiatry

## 2023-05-04 ENCOUNTER — Encounter: Payer: Self-pay | Admitting: Podiatry

## 2023-05-04 DIAGNOSIS — M7662 Achilles tendinitis, left leg: Secondary | ICD-10-CM

## 2023-05-04 MED ORDER — TRIAMCINOLONE ACETONIDE 10 MG/ML IJ SUSP
10.0000 mg | Freq: Once | INTRAMUSCULAR | Status: AC
  Administered 2023-05-04: 10 mg via INTRA_ARTICULAR

## 2023-05-04 MED ORDER — DICLOFENAC SODIUM 75 MG PO TBEC: 75.0000 mg | DELAYED_RELEASE_TABLET | Freq: Two times a day (BID) | ORAL | 4 refills | Status: AC

## 2023-05-04 NOTE — Patient Instructions (Signed)

## 2023-05-06 NOTE — Progress Notes (Signed)
Subjective:   Patient ID: Diane Franco, female   DOB: 67 y.o.   MRN: [id number]   HPI Patient states has been doing pretty well but over the last couple months has started to develop pain again in the Achilles   ROS      Objective:  Physical Exam  Neurovascular status intact with the patient's lateral Achilles found to be inflamed and has just reoccurred over the last couple months     Assessment:  Reoccurrence Achilles tendinitis which responded well last year with patient also taking oral medicine but scared about taking it and takes it only periodically     Plan:  H&P reviewed I have recommended the continuation of oral but taking it just in doses as needed I do not think she needs to take it all the time and if she does she needs to get blood work done to check kidney function.  I went ahead today did recommend injection I explained again the risk of this she wants to have this done and I went ahead today and I did sterile prep carefully on the lateral side I injected with 3 mg dexamethasone Kenalog 5 mg Xylocaine keep it away central medial portion of the tendon and advised on reduced activity

## 2023-08-10 ENCOUNTER — Other Ambulatory Visit: Payer: Self-pay | Admitting: Family Medicine

## 2023-08-10 DIAGNOSIS — Z1231 Encounter for screening mammogram for malignant neoplasm of breast: Secondary | ICD-10-CM

## 2023-08-14 ENCOUNTER — Ambulatory Visit
Admission: RE | Admit: 2023-08-14 | Discharge: 2023-08-14 | Disposition: A | Discharge status: Home / Self Care | Payer: PPO | Source: Ambulatory Visit | Attending: Family Medicine | Admitting: Family Medicine

## 2023-08-14 DIAGNOSIS — Z1231 Encounter for screening mammogram for malignant neoplasm of breast: Secondary | ICD-10-CM

## 2024-01-12 ENCOUNTER — Other Ambulatory Visit: Payer: Self-pay | Admitting: Podiatry

## 2024-08-07 ENCOUNTER — Other Ambulatory Visit: Payer: Self-pay | Admitting: Family Medicine

## 2024-08-07 DIAGNOSIS — Z1231 Encounter for screening mammogram for malignant neoplasm of breast: Secondary | ICD-10-CM

## 2024-08-28 ENCOUNTER — Ambulatory Visit
Admission: RE | Admit: 2024-08-28 | Discharge: 2024-08-28 | Disposition: A | Discharge status: Home / Self Care | Source: Ambulatory Visit | Attending: Family Medicine | Admitting: Family Medicine

## 2024-08-28 DIAGNOSIS — Z1231 Encounter for screening mammogram for malignant neoplasm of breast: Secondary | ICD-10-CM
